# Patient Record
Sex: Male | Born: 2000 | Race: White | Hispanic: No | Marital: Single | Smoking: Never smoker
Health system: Southern US, Community
[De-identification: ages and names within clinical notes are randomized; demographics above are authoritative.]

## PROBLEM LIST (undated history)

## (undated) DIAGNOSIS — S43006A Unspecified dislocation of unspecified shoulder joint, initial encounter: Secondary | ICD-10-CM

## (undated) DIAGNOSIS — F32A Depression, unspecified: Secondary | ICD-10-CM

## (undated) DIAGNOSIS — U071 COVID-19: Secondary | ICD-10-CM

## (undated) DIAGNOSIS — F329 Major depressive disorder, single episode, unspecified: Secondary | ICD-10-CM

---
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
## 2019-07-12 ENCOUNTER — Encounter: Payer: Self-pay | Admitting: Emergency Medicine

## 2019-07-12 ENCOUNTER — Emergency Department: Payer: Medicaid Other

## 2019-07-12 ENCOUNTER — Emergency Department
Admission: EM | Admit: 2019-07-12 | Discharge: 2019-07-12 | Disposition: A | Discharge status: Home / Self Care | Payer: Medicaid Other | Attending: Emergency Medicine | Admitting: Emergency Medicine

## 2019-07-12 ENCOUNTER — Emergency Department: Payer: Medicaid Other | Attending: Emergency Medicine

## 2019-07-12 ENCOUNTER — Other Ambulatory Visit: Payer: Self-pay

## 2019-07-12 DIAGNOSIS — Y9389 Activity, other specified: Secondary | ICD-10-CM | POA: Insufficient documentation

## 2019-07-12 DIAGNOSIS — Y990 Civilian activity done for income or pay: Secondary | ICD-10-CM | POA: Insufficient documentation

## 2019-07-12 DIAGNOSIS — Y9259 Other trade areas as the place of occurrence of the external cause: Secondary | ICD-10-CM | POA: Insufficient documentation

## 2019-07-12 DIAGNOSIS — X500XXA Overexertion from strenuous movement or load, initial encounter: Secondary | ICD-10-CM | POA: Diagnosis not present

## 2019-07-12 DIAGNOSIS — S43015A Anterior dislocation of left humerus, initial encounter: Secondary | ICD-10-CM | POA: Insufficient documentation

## 2019-07-12 DIAGNOSIS — S43005A Unspecified dislocation of left shoulder joint, initial encounter: Secondary | ICD-10-CM

## 2019-07-12 DIAGNOSIS — S4992XA Unspecified injury of left shoulder and upper arm, initial encounter: Secondary | ICD-10-CM | POA: Diagnosis present

## 2019-07-12 MED ORDER — ONDANSETRON HCL 4 MG/2ML IJ SOLN
4.0000 mg | Freq: Once | INTRAMUSCULAR | Status: AC
  Administered 2019-07-12: 4 mg via INTRAVENOUS
  Filled 2019-07-12: qty 2

## 2019-07-12 MED ORDER — FENTANYL CITRATE (PF) 100 MCG/2ML IJ SOLN
100.0000 ug | Freq: Once | INTRAMUSCULAR | Status: AC
  Administered 2019-07-12: 100 ug via INTRAVENOUS
  Filled 2019-07-12: qty 2

## 2019-07-12 MED ORDER — MELOXICAM 15 MG PO TABS: 15.0000 mg | ORAL_TABLET | Freq: Every day | ORAL | 0 refills | Status: DC

## 2019-07-12 NOTE — ED Triage Notes (Signed)
Patient presents to the ED with left shoulder pain.  Patient states he tried to pick up a box at work and heard multiple pops.  Patient states he has dislocated his left shoulder previously and this feels the same.  Patient is in no obvious distress at this time.

## 2019-07-12 NOTE — ED Notes (Signed)
ED Provider at bedside. 

## 2019-07-12 NOTE — ED Notes (Signed)
Patient's supervisor, Annia Belt, states patient does not need breath analysis or UDS.

## 2019-07-12 NOTE — ED Provider Notes (Signed)
[facility] Emergency Department Provider Note  ____________________________________________  Time seen: Approximately 7:10 PM  I have reviewed the triage vital signs and the nursing notes.   HISTORY  Chief Complaint Shoulder Pain    HPI Andre Beck is a 18 y.o. male who presents the emergency department complaining of left shoulder pain/injury.  Patient states that he was at work, attempted to lift a medium weight box out of a truck when he felt his shoulder "give away", and heard a popping sound.  Patient  states that he has dislocated the shoulder approximately 8 months ago.  Symptoms felt exactly the same and patient has reduced range of motion to the shoulder.  No other injury or complaint.  No medications prior to arrival.        History reviewed. No pertinent past medical history.  There are no active problems to display for this patient.   History reviewed. No pertinent surgical history.  Prior to Admission medications   Medication Sig Start Date End Date Taking? Authorizing Provider  meloxicam (MOBIC) 15 MG tablet Take 1 tablet (15 mg total) by mouth daily. 07/12/19   Deshanna Kama, Charline Bills, PA-C    Allergies Patient has no known allergies.  No family history on file.  Social History Social History   Tobacco Use  . Smoking status: Never Smoker  . Smokeless tobacco: Never Used  Substance Use Topics  . Alcohol use: Never    Frequency: Never  . Drug use: Never     Review of Systems  Constitutional: No fever/chills Eyes: No visual changes. No discharge ENT: No upper respiratory complaints. Cardiovascular: no chest pain. Respiratory: no cough. No SOB. Gastrointestinal: No abdominal pain.  No nausea, no vomiting.  No diarrhea.  No constipation. Musculoskeletal: Positive for left shoulder injury/possible dislocation Skin: Negative for rash, abrasions, lacerations, ecchymosis. Neurological: Negative for headaches, focal weakness or  numbness. 10-point ROS otherwise negative.  ____________________________________________   PHYSICAL EXAM:  VITAL SIGNS: ED Triage Vitals  Enc Vitals Group     BP 07/12/19 1759 (!) 141/76     Pulse Rate 07/12/19 1759 98     Resp 07/12/19 1759 16     Temp 07/12/19 1759 98.6 F (37 C)     Temp Source 07/12/19 1759 Oral     SpO2 07/12/19 1759 100 %     Weight 07/12/19 1800 150 lb (68 kg)     Height 07/12/19 1800 5\' 8"  (1.727 m)     Head Circumference --      Peak Flow --      Pain Score 07/12/19 1800 10     Pain Loc --      Pain Edu? --      Excl. in [place]? --      Constitutional: Alert and oriented. Well appearing and in no acute distress. Eyes: Conjunctivae are normal. PERRL. EOMI. Head: Atraumatic. ENT:      Ears:       Nose: No congestion/rhinnorhea.      Mouth/Throat: Mucous membranes are moist.  Neck: No stridor.    Cardiovascular: Normal rate, regular rhythm. Normal S1 and S2.  Good peripheral circulation. Respiratory: Normal respiratory effort without tachypnea or retractions. Lungs CTAB. Good air entry to the bases with no decreased or absent breath sounds. Musculoskeletal: Full range of motion to all extremities. No gross deformities appreciated.  Visualization of the left shoulder reveals deformity with identified deformity along the anterior aspect of the shoulder.  No range of motion at  this time.  Patient has good range of motion to the elbow, wrist.  Palpation reveals palpable deficit with palpable abnormality along the anterior aspect consistent with shoulder dislocation.  Examination of the elbow and wrist is unremarkable.  Radial pulse intact distally.  Sensation intact all digits distally.  After reduction, no ongoing visible or palpable abnormality.  Patient has good range of motion to the shoulder joint at this time.  Pulse and sensation intact distally after reduction. Neurologic:  Normal speech and language. No gross focal neurologic deficits are appreciated.   Skin:  Skin is warm, dry and intact. No rash noted. Psychiatric: Mood and affect are normal. Speech and behavior are normal. Patient exhibits appropriate insight and judgement.   ____________________________________________   LABS (all labs ordered are listed, but only abnormal results are displayed)  Labs Reviewed - No data to display ____________________________________________  EKG   ____________________________________________  RADIOLOGY I personally viewed and evaluated these images as part of my medical decision making, as well as reviewing the written report by the radiologist.  Dg Shoulder Left  Result Date: 07/12/2019 CLINICAL DATA:  Status post reduction EXAM: LEFT SHOULDER - 2+ VIEW COMPARISON:  Film from earlier in the same day. FINDINGS: Humeral head is been reduced into the glenoid. No fracture or dislocation is noted. No soft tissue abnormality is seen. IMPRESSION: Status post reduction. Electronically Signed   By: Alcide CleverMark  Lukens M.D.   On: 07/12/2019 20:37   Dg Shoulder Left  Result Date: 07/12/2019 CLINICAL DATA:  Left shoulder pain, popping injury. EXAM: LEFT SHOULDER - 2+ VIEW COMPARISON:  11/19/2018 FINDINGS: There is anterior inferior dislocation of the right humeral head with SPECT of the glenoid. No definite Hill-Sachs impaction or bony Bankart injury. No other significant bony abnormality. IMPRESSION: 1. Anterior-inferior dislocation of the humeral head with respect to the glenoid. Electronically Signed   By: Gaylyn RongWalter  Liebkemann M.D.   On: 07/12/2019 19:17    ____________________________________________    PROCEDURES  Procedure(s) performed:    Reduction of dislocation  Date/Time: 07/12/2019 8:19 PM Performed by: Racheal Patchesuthriell, Katerine Morua D, PA-C Authorized by: Racheal Patchesuthriell, Azarie Coriz D, PA-C  Consent: Verbal consent obtained. Risks and benefits: risks, benefits and alternatives were discussed Consent given by: patient Patient understanding: patient states  understanding of the procedure being performed Imaging studies: imaging studies available Required items: required blood products, implants, devices, and special equipment available Patient identity confirmed: verbally with patient Time out: Immediately prior to procedure a "time out" was called to verify the correct patient, procedure, equipment, support staff and site/side marked as required. Local anesthesia used: no  Anesthesia: Local anesthesia used: no  Sedation: Patient sedated: no  Patient tolerance: patient tolerated the procedure well with no immediate complications Comments: Patient had good pulses and sensation prior to procedure.  Patient was placed prone, using downward traction, muscles were fatigued.  Using traction with elbow slightly flexed, outward/lateral rotation of the humerus with manipulation of the scapula resulted in good reduction of dislocation.  Patient has good range of motion, pulses and sensation status post procedure.  Patient tolerated procedure well without complication.  Marland Kitchen.Splint Application  Date/Time: 07/12/2019 8:21 PM Performed by: Racheal Patchesuthriell, Kristan Votta D, PA-C Authorized by: Racheal Patchesuthriell, Goldie Tregoning D, PA-C   Consent:    Consent obtained:  Verbal   Consent given by:  Patient   Risks discussed:  Pain Pre-procedure details:    Sensation:  Normal Procedure details:    Laterality:  Left   Location:  Shoulder   Shoulder:  L  shoulder   Supplies:  Sling Post-procedure details:    Pain:  Improved   Sensation:  Normal   Patient tolerance of procedure:  Tolerated well, no immediate complications      Medications  fentaNYL (SUBLIMAZE) injection 100 mcg (100 mcg Intravenous Given 07/12/19 1938)  ondansetron (ZOFRAN) injection 4 mg (4 mg Intravenous Given 07/12/19 1938)     ____________________________________________   INITIAL IMPRESSION / ASSESSMENT AND PLAN / ED COURSE  Pertinent labs & imaging results that were available during my care of  the patient were reviewed by me and considered in my medical decision making (see chart for details).  Review of the [place] CSRS was performed in accordance of the [facility] prior to dispensing any controlled drugs.           Patient's diagnosis is consistent with left shoulder dislocation.  Patient presented to emergency department with left shoulder injury.  Patient has a history of previous shoulder dislocations.  Findings were consistent with shoulder dislocation at this time.  Shoulder was successfully reduced as described above.  Patient tolerated well.  Sling given to the patient for mobilization.  Follow-up with orthopedics.  Meloxicam for symptom relief.. Patient is given ED precautions to return to the ED for any worsening or new symptoms.     ____________________________________________  FINAL CLINICAL IMPRESSION(S) / ED DIAGNOSES  Final diagnoses:  Dislocation of left shoulder joint, initial encounter      NEW MEDICATIONS STARTED DURING THIS VISIT:  ED Discharge Orders         Ordered    meloxicam (MOBIC) 15 MG tablet  Daily     07/12/19 2102              This chart was dictated using voice recognition software/Dragon. Despite best efforts to proofread, errors can occur which can change the meaning. Any change was purely unintentional.    Racheal Patches, PA-C 07/12/19 2103    Dionne Bucy, MD 07/12/19 2316

## 2019-07-12 NOTE — ED Notes (Signed)
Sling applied to left shoulder

## 2019-07-29 ENCOUNTER — Other Ambulatory Visit: Payer: Self-pay | Admitting: Orthopedic Surgery

## 2019-07-29 DIAGNOSIS — S43005A Unspecified dislocation of left shoulder joint, initial encounter: Secondary | ICD-10-CM

## 2019-08-13 ENCOUNTER — Other Ambulatory Visit: Payer: Self-pay

## 2019-08-13 ENCOUNTER — Ambulatory Visit
Admission: RE | Admit: 2019-08-13 | Discharge: 2019-08-13 | Disposition: A | Discharge status: Home / Self Care | Payer: Medicaid Other | Source: Ambulatory Visit | Attending: Orthopedic Surgery | Admitting: Orthopedic Surgery

## 2019-08-13 DIAGNOSIS — S43005A Unspecified dislocation of left shoulder joint, initial encounter: Secondary | ICD-10-CM | POA: Insufficient documentation

## 2019-08-13 MED ORDER — GADOBUTROL 1 MMOL/ML IV SOLN
2.0000 mL | Freq: Once | INTRAVENOUS | Status: AC | PRN
  Administered 2019-08-13: 0.5 mL

## 2019-08-13 MED ORDER — LIDOCAINE HCL (PF) 1 % IJ SOLN
5.0000 mL | Freq: Once | INTRAMUSCULAR | Status: AC
  Administered 2019-08-13: 5 mL
  Filled 2019-08-13: qty 5

## 2019-08-13 MED ORDER — IOHEXOL 180 MG/ML  SOLN
20.0000 mL | Freq: Once | INTRAMUSCULAR | Status: AC | PRN
  Administered 2019-08-13: 15 mL

## 2019-08-13 MED ORDER — SODIUM CHLORIDE (PF) 0.9 % IJ SOLN
10.0000 mL | INTRAMUSCULAR | Status: DC | PRN
  Administered 2019-08-13: 10 mL via INTRAVENOUS

## 2019-09-10 ENCOUNTER — Emergency Department
Admission: EM | Admit: 2019-09-10 | Discharge: 2019-09-10 | Disposition: A | Discharge status: Home / Self Care | Payer: Medicaid Other | Attending: Emergency Medicine | Admitting: Emergency Medicine

## 2019-09-10 ENCOUNTER — Emergency Department: Payer: Medicaid Other

## 2019-09-10 ENCOUNTER — Other Ambulatory Visit: Payer: Self-pay

## 2019-09-10 ENCOUNTER — Encounter: Payer: Self-pay | Admitting: Emergency Medicine

## 2019-09-10 DIAGNOSIS — S43015A Anterior dislocation of left humerus, initial encounter: Secondary | ICD-10-CM

## 2019-09-10 DIAGNOSIS — Y9389 Activity, other specified: Secondary | ICD-10-CM | POA: Diagnosis not present

## 2019-09-10 DIAGNOSIS — Y999 Unspecified external cause status: Secondary | ICD-10-CM | POA: Insufficient documentation

## 2019-09-10 DIAGNOSIS — S4991XA Unspecified injury of right shoulder and upper arm, initial encounter: Secondary | ICD-10-CM | POA: Diagnosis present

## 2019-09-10 DIAGNOSIS — Y929 Unspecified place or not applicable: Secondary | ICD-10-CM | POA: Insufficient documentation

## 2019-09-10 DIAGNOSIS — Z79899 Other long term (current) drug therapy: Secondary | ICD-10-CM | POA: Diagnosis not present

## 2019-09-10 DIAGNOSIS — W010XXA Fall on same level from slipping, tripping and stumbling without subsequent striking against object, initial encounter: Secondary | ICD-10-CM | POA: Diagnosis not present

## 2019-09-10 HISTORY — DX: Unspecified dislocation of unspecified shoulder joint, initial encounter: S43.006A

## 2019-09-10 MED ORDER — FENTANYL CITRATE (PF) 100 MCG/2ML IJ SOLN
50.0000 ug | Freq: Once | INTRAMUSCULAR | Status: AC
  Administered 2019-09-10: 02:00:00 50 ug via INTRAVENOUS

## 2019-09-10 MED ORDER — FENTANYL CITRATE (PF) 100 MCG/2ML IJ SOLN
INTRAMUSCULAR | Status: AC
  Filled 2019-09-10: qty 2

## 2019-09-10 MED ORDER — FENTANYL CITRATE (PF) 100 MCG/2ML IJ SOLN: 50.0000 ug | Freq: Once | INTRAMUSCULAR | Status: DC

## 2019-09-10 NOTE — ED Triage Notes (Signed)
Pt to triage via w/c, mask in place; reports falling and dislocating left shoulder (st 3rd time)

## 2019-09-10 NOTE — ED Notes (Signed)
Left shoulder reduced by Dr. York Cerise, immobilizer in place.

## 2019-09-10 NOTE — ED Provider Notes (Signed)
[facility] Emergency Department Provider Note  ____________________________________________   First MD Initiated Contact with Patient 09/10/19 0128     (approximate)  I have reviewed the triage vital signs and the nursing notes.   HISTORY  Chief Complaint Shoulder Injury    HPI Andre Beck is a 19 y.o. male with a history of 2 prior left shoulder dislocations who presents tonight after mechanical fall at home with acute onset severe pain similar to his prior dislocation.  He states that he sees Dr. Odis Luster with orthopedics and that he has had a recent MRI that showed no internal injury or tear.  He got up to go the bathroom tonight it was on his way back to bed when he tripped on something and fell, catching his left arm on something and causing the immediate pain and deformity.  He did not strike his head, did not lose consciousness, denies headache and neck pain.  Only pain is in his left shoulder.  Is severe and worse with any amount of movement.  He has no numbness nor tingling.  No chest pain or shortness of breath.  No nausea or vomiting.         Past Medical History:  Diagnosis Date  . Dislocated shoulder     There are no problems to display for this patient.   History reviewed. No pertinent surgical history.  Prior to Admission medications   Medication Sig Start Date End Date Taking? Authorizing Provider  meloxicam (MOBIC) 15 MG tablet Take 1 tablet (15 mg total) by mouth daily. 07/12/19   Cuthriell, Delorise Royals, PA-C    Allergies Patient has no known allergies.  No family history on file.  Social History Social History   Tobacco Use  . Smoking status: Never Smoker  . Smokeless tobacco: Never Used  Substance Use Topics  . Alcohol use: Never  . Drug use: Never    Review of Systems Constitutional: No fever/chills ENT: No sore throat. Cardiovascular: Denies chest pain. Respiratory: Denies shortness of breath. Gastrointestinal:  No abdominal pain.  No nausea, no vomiting.   Genitourinary: Negative for dysuria. Musculoskeletal: Acute onset severe pain and limited range of motion in the left shoulder. Integumentary: Negative for laceration. Neurological: Negative for headaches, focal weakness or numbness. Psychiatric:  No complaints or concerns  ____________________________________________   PHYSICAL EXAM:  VITAL SIGNS: ED Triage Vitals  Enc Vitals Group     BP 09/10/19 0100 (!) 134/97     Pulse Rate 09/10/19 0100 97     Resp 09/10/19 0100 18     Temp 09/10/19 0100 99 F (37.2 C)     Temp Source 09/10/19 0100 Oral     SpO2 09/10/19 0100 99 %     Weight 09/10/19 0059 68 kg (150 lb)     Height 09/10/19 0059 1.753 m (5\' 9" )     Head Circumference --      Peak Flow --      Pain Score 09/10/19 0059 10     Pain Loc --      Pain Edu? --      Excl. in GC? --     Constitutional: Alert and oriented.  Appears uncomfortable and in pain. Eyes: Conjunctivae are normal.  Head: Atraumatic. Nose: No congestion/rhinnorhea. Mouth/Throat: Patient is wearing a mask. Neck: No stridor.  No meningeal signs.   Cardiovascular: Normal rate, regular rhythm. Good peripheral circulation. Grossly normal heart sounds. Respiratory: Normal respiratory effort.  No retractions. Gastrointestinal: Soft and  nontender. No distention.  Musculoskeletal: Obvious deformity of the left shoulder consistent with anterior glenohumeral dislocation.  Limited range of motion. Neurologic:  Normal speech and language. No gross focal neurologic deficits are appreciated.  Skin:  Skin is warm, dry and intact. Psychiatric: Mood and affect are normal. Speech and behavior are normal.  ____________________________________________   LABS (all labs ordered are listed, but only abnormal results are displayed)  Labs Reviewed - No data to display ____________________________________________  EKG  None - EKG not ordered by ED  physician ____________________________________________  RADIOLOGY I, Hinda Kehr, personally viewed and evaluated these images (plain radiographs) as part of my medical decision making, as well as reviewing the written report by the radiologist.  ED MD interpretation: Anterior left glenohumeral dislocation with no obvious fracture.  Post reduction, there is near-anatomic alignment still with no definitive fracture identified.  Official radiology report(s): DG Shoulder Left  Result Date: 09/10/2019 CLINICAL DATA:  19 year old male with left shoulder pain and deformity after fall. History of prior dislocations. EXAM: LEFT SHOULDER - 2+ VIEW COMPARISON:  07/12/2019. FINDINGS: Anterior subcoracoid left glenohumeral joint dislocation. The proximal left humerus appears to remain intact. Left clavicle and scapula appear intact. Negative visible left ribs and chest. IMPRESSION: Anterior left glenohumeral dislocation with no fracture identified. Electronically Signed   By: Genevie Ann M.D.   On: 09/10/2019 00:43   DG Shoulder Left Portable  Result Date: 09/10/2019 CLINICAL DATA:  Postreduction EXAM: LEFT SHOULDER COMPARISON:  Radiograph same day FINDINGS: The patient is status post reduction of anterior shoulder dislocation. Humeral head is now well seated within the glenoid. No definite acute fracture seen. IMPRESSION: Status post reduction in near anatomic alignment. Electronically Signed   By: Prudencio Pair M.D.   On: 09/10/2019 02:15    ____________________________________________   PROCEDURES   Procedure(s) performed (including Critical Care):  .Ortho Injury Treatment  Date/Time: 09/10/2019 1:55 AM Performed by: Hinda Kehr, MD Authorized by: Hinda Kehr, MD   Consent:    Consent obtained:  Verbal and written   Consent given by:  Patient   Risks discussed:  Fracture, irreducible dislocation, nerve damage, recurrent dislocation, restricted joint movement and stiffnessInjury location:  shoulder Location details: left shoulder Injury type: dislocation Dislocation type: anterior Chronicity: recurrent Pre-procedure neurovascular assessment: neurovascularly intact Pre-procedure distal perfusion: normal Pre-procedure neurological function: normal Pre-procedure range of motion: reduced  Anesthesia: Local anesthesia used: no  Patient sedated: NoManipulation performed: yes Reduction method: external rotation Reduction successful: yes X-ray confirmed reduction: yes Immobilization: sling (shoulder immobilizer) Post-procedure neurovascular assessment: post-procedure neurovascularly intact Post-procedure distal perfusion: normal Post-procedure neurological function: normal Post-procedure range of motion: improved Patient tolerance: patient tolerated the procedure well with no immediate complications      ____________________________________________   INITIAL IMPRESSION / MDM / ASSESSMENT AND PLAN / ED COURSE  As part of my medical decision making, I reviewed the following data within the [place] notes reviewed and incorporated, Labs reviewed , Radiograph reviewed , Notes from prior ED visits and [place] Controlled Substance Database   Differential diagnosis includes, but is not limited to, recurrent shoulder dislocation, Hill-Sachs deformity, fracture.  No evidence of neurological injury.  The patient has had 2 dislocations previously and the last one did not require sedation.  I provided fentanyl 50 mcg IV and then performed a reduction with external rotation and felt the successful reduction.  The patient had immediate improvement of pain after the initial pain of the reduction.  Shoulder immobilizer was placed and  I am awaiting postreduction films.  He likely will be able to be discharged and follow-up with orthopedics.      Clinical Course as of Sep 10 235  Wed Sep 10, 2019  0229 Near-anatomic alignment post reduction.  Patient is  comfortable with no ongoing pain.  No evidence of fracture.  Patient will follow with Dr. Odis Luster with whom he already has a patient relationship.  I gave my usual customary management recommendations and return precautions.  DG Shoulder Left Portable [CF]    Clinical Course User Index [CF] Loleta Rose, MD     ____________________________________________  FINAL CLINICAL IMPRESSION(S) / ED DIAGNOSES  Final diagnoses:  Anterior dislocation of left shoulder, initial encounter     MEDICATIONS GIVEN DURING THIS VISIT:  Medications  fentaNYL (SUBLIMAZE) injection 50 mcg (50 mcg Intravenous Given 09/10/19 0142)     ED Discharge Orders    None      *Please note:  Andre Beck was evaluated in Emergency Department on 09/10/2019 for the symptoms described in the history of present illness. He was evaluated in the context of the global COVID-19 pandemic, which necessitated consideration that the patient might be at risk for infection with the SARS-CoV-2 virus that causes COVID-19. Institutional protocols and algorithms that pertain to the evaluation of patients at risk for COVID-19 are in a state of rapid change based on information released by regulatory bodies including the CDC and federal and state organizations. These policies and algorithms were followed during the patient's care in the ED.  Some ED evaluations and interventions may be delayed as a result of limited staffing during the pandemic.*  Note:  This document was prepared using Dragon voice recognition software and may include unintentional dictation errors.   Loleta Rose, MD 09/10/19 [phone]

## 2019-09-10 NOTE — ED Notes (Signed)
X-ray at bedside

## 2019-09-10 NOTE — Discharge Instructions (Addendum)
You have been seen in the Emergency Department (ED) today for a shoulder dislocation.  It was put back in place in the ED.  Please follow up as written with the recommend orthopedic surgeon.  It is important you leave the shoulder immobilizer in place until they tell you to remove it.  Use over-the-counter pain medication (ibuprofen, Tylenol, etc) as needed according to label instructions.  If you have worsening pain or swelling, if the shoulder dislocates again, or if you have any other symptoms that concern you, please return immediately to the Emergency Department.

## 2019-09-10 NOTE — ED Notes (Signed)
Verbal consent given per pt for reduction of left shoulder dislocation.

## 2019-10-13 ENCOUNTER — Other Ambulatory Visit: Payer: Self-pay | Admitting: Orthopedic Surgery

## 2019-10-27 ENCOUNTER — Other Ambulatory Visit: Payer: Self-pay

## 2019-10-27 ENCOUNTER — Encounter
Admission: RE | Admit: 2019-10-27 | Discharge: 2019-10-27 | Disposition: A | Discharge status: Home / Self Care | Payer: Medicaid Other | Source: Ambulatory Visit | Attending: Orthopedic Surgery | Admitting: Orthopedic Surgery

## 2019-10-27 DIAGNOSIS — Z01818 Encounter for other preprocedural examination: Secondary | ICD-10-CM | POA: Insufficient documentation

## 2019-10-27 HISTORY — DX: Depression, unspecified: F32.A

## 2019-10-27 NOTE — Patient Instructions (Signed)
Your procedure is scheduled on: Thursday November 06, 2019 Report to Day Surgery. To find out your arrival time please call [phone] between 1PM - 3PM on Wednesday November 05, 2019.  Remember: Instructions that are not followed completely may result in serious medical risk,  up to and including death, or upon the discretion of your surgeon and anesthesiologist your  surgery may need to be rescheduled.     _X__ 1. Do not eat food after midnight the night before your procedure.                 No gum chewing or hard candies. You may drink clear liquids up to 2 hours                 before you are scheduled to arrive for your surgery- DO not drink clear                 liquids within 2 hours of the start of your surgery.                 Clear Liquids include:  water, apple juice without pulp, clear Gatorade, G2 or                  Gatorade Zero (avoid Red/Purple/Blue), Black Coffee or Tea (Do not add                 anything to coffee or tea).  __X__2.  On the morning of surgery brush your teeth with toothpaste and water, you                may rinse your mouth with mouthwash if you wish.  Do not swallow any toothpaste of mouthwash.     _X__ 3.  No Alcohol for 24 hours before or after surgery.   _X__ 4.  Do Not Smoke or use e-cigarettes For 24 Hours Prior to Your Surgery.                 Do not use any chewable tobacco products for at least 6 hours prior to                 surgery.   __x__  5.  Notify your doctor if there is any change in your medical condition      (cold, fever, infections).     Do not wear jewelry, make-up, hairpins, clips or nail polish. Do not wear lotions, powders, or perfumes. You may wear deodorant. Do not shave 48 hours prior to surgery. Men may shave face and neck. Do not bring valuables to the hospital.    [facility] is not responsible for any belongings or valuables.  Contacts, dentures or bridgework may not be worn into  surgery. Leave your suitcase in the car. After surgery it may be brought to your room. For patients admitted to the hospital, discharge time is determined by your treatment team.   Patients discharged the day of surgery will not be allowed to drive home.   Make arrangements for someone to be with you for the first 24 hours of your Same Day Discharge.   ____ Take these medicines the morning of surgery with A SIP OF WATER:    1. None     __x__ Use CHG Soap as directed  __x__ Stop Anti-inflammatories such as meloxicam (MOBIC), ibuprofen, Aleve, naproxen, aspirin and or BC powders     __x__ Stop supplements until after surgery.  _____ Do not  start any herbal supplements before your surgery.

## 2019-11-04 ENCOUNTER — Other Ambulatory Visit
Admission: RE | Admit: 2019-11-04 | Discharge: 2019-11-04 | Disposition: A | Discharge status: Home / Self Care | Payer: Medicaid Other | Source: Ambulatory Visit | Attending: Orthopedic Surgery | Admitting: Orthopedic Surgery

## 2019-11-04 ENCOUNTER — Other Ambulatory Visit: Payer: Self-pay

## 2019-11-04 DIAGNOSIS — Z20822 Contact with and (suspected) exposure to covid-19: Secondary | ICD-10-CM | POA: Insufficient documentation

## 2019-11-04 DIAGNOSIS — Z01812 Encounter for preprocedural laboratory examination: Secondary | ICD-10-CM | POA: Insufficient documentation

## 2019-11-04 LAB — CBC WITH DIFFERENTIAL/PLATELET
Abs Immature Granulocytes: 0.02 10*3/uL (ref 0.00–0.07)
Basophils Absolute: 0 10*3/uL (ref 0.0–0.1)
Basophils Relative: 1 %
Eosinophils Absolute: 0.2 10*3/uL (ref 0.0–0.5)
Eosinophils Relative: 3 %
HCT: 47.6 % (ref 39.0–52.0)
Hemoglobin: 16.5 g/dL (ref 13.0–17.0)
Immature Granulocytes: 0 %
Lymphocytes Relative: 40 %
Lymphs Abs: 2.2 10*3/uL (ref 0.7–4.0)
MCH: 30.3 pg (ref 26.0–34.0)
MCHC: 34.7 g/dL (ref 30.0–36.0)
MCV: 87.5 fL (ref 80.0–100.0)
Monocytes Absolute: 0.5 10*3/uL (ref 0.1–1.0)
Monocytes Relative: 10 %
Neutro Abs: 2.5 10*3/uL (ref 1.7–7.7)
Neutrophils Relative %: 46 %
Platelets: 300 10*3/uL (ref 150–400)
RBC: 5.44 MIL/uL (ref 4.22–5.81)
RDW: 11.7 % (ref 11.5–15.5)
WBC: 5.5 10*3/uL (ref 4.0–10.5)
nRBC: 0 % (ref 0.0–0.2)

## 2019-11-04 LAB — BASIC METABOLIC PANEL
Anion gap: 10 (ref 5–15)
BUN: 15 mg/dL (ref 6–20)
CO2: 27 mmol/L (ref 22–32)
Calcium: 9.6 mg/dL (ref 8.9–10.3)
Chloride: 101 mmol/L (ref 98–111)
Creatinine, Ser: 1.07 mg/dL (ref 0.61–1.24)
GFR calc Af Amer: >60 mL/min (ref >60)
GFR calc non Af Amer: >60 mL/min (ref >60)
Glucose, Bld: 99 mg/dL (ref 70–99)
Potassium: 3.9 mmol/L (ref 3.5–5.1)
Sodium: 138 mmol/L (ref 135–145)

## 2019-11-04 LAB — APTT: aPTT: 28 seconds (ref 24–36)

## 2019-11-04 LAB — PROTIME-INR
INR: 1 (ref 0.8–1.2)
Prothrombin Time: 12.9 seconds (ref 11.4–15.2)

## 2019-11-04 LAB — SARS CORONAVIRUS 2 (TAT 6-24 HRS): SARS Coronavirus 2: NEGATIVE

## 2019-11-06 ENCOUNTER — Ambulatory Visit
Admission: RE | Admit: 2019-11-06 | Discharge: 2019-11-06 | Disposition: A | Discharge status: Home / Self Care | Payer: Medicaid Other | Attending: Orthopedic Surgery | Admitting: Orthopedic Surgery

## 2019-11-06 ENCOUNTER — Other Ambulatory Visit: Payer: Self-pay

## 2019-11-06 ENCOUNTER — Encounter: Payer: Self-pay | Admitting: Orthopedic Surgery

## 2019-11-06 ENCOUNTER — Ambulatory Visit: Payer: Medicaid Other | Admitting: Anesthesiology

## 2019-11-06 ENCOUNTER — Ambulatory Visit: Payer: Medicaid Other

## 2019-11-06 ENCOUNTER — Encounter
Admission: RE | Disposition: A | Discharge status: Home / Self Care | Payer: Self-pay | Source: Home / Self Care | Attending: Orthopedic Surgery

## 2019-11-06 DIAGNOSIS — F419 Anxiety disorder, unspecified: Secondary | ICD-10-CM | POA: Diagnosis not present

## 2019-11-06 DIAGNOSIS — M24412 Recurrent dislocation, left shoulder: Secondary | ICD-10-CM | POA: Diagnosis present

## 2019-11-06 DIAGNOSIS — Z79899 Other long term (current) drug therapy: Secondary | ICD-10-CM | POA: Insufficient documentation

## 2019-11-06 DIAGNOSIS — F329 Major depressive disorder, single episode, unspecified: Secondary | ICD-10-CM | POA: Diagnosis not present

## 2019-11-06 DIAGNOSIS — S43439A Superior glenoid labrum lesion of unspecified shoulder, initial encounter: Secondary | ICD-10-CM

## 2019-11-06 HISTORY — PX: SHOULDER ARTHROSCOPY WITH CAPSULORRHAPHY: SHX6454

## 2019-11-06 SURGERY — SHOULDER ATHROSCOPY WITH CAPSULORRHAPHY
Anesthesia: General | Site: Shoulder | Laterality: Left

## 2019-11-06 MED ORDER — PROMETHAZINE HCL 25 MG/ML IJ SOLN: 6.2500 mg | INTRAMUSCULAR | Status: DC | PRN

## 2019-11-06 MED ORDER — MIDAZOLAM HCL 2 MG/2ML IJ SOLN
INTRAMUSCULAR | Status: AC
  Filled 2019-11-06: qty 2

## 2019-11-06 MED ORDER — DEXAMETHASONE SODIUM PHOSPHATE 10 MG/ML IJ SOLN
INTRAMUSCULAR | Status: DC | PRN
  Administered 2019-11-06: 5 mg via INTRAVENOUS

## 2019-11-06 MED ORDER — EPHEDRINE SULFATE 50 MG/ML IJ SOLN
INTRAMUSCULAR | Status: DC | PRN
  Administered 2019-11-06 (×2): 10 mg via INTRAVENOUS
  Administered 2019-11-06 (×2): 5 mg via INTRAVENOUS

## 2019-11-06 MED ORDER — LACTATED RINGERS IV SOLN: INTRAVENOUS | Status: DC

## 2019-11-06 MED ORDER — LIDOCAINE HCL (PF) 1 % IJ SOLN
INTRAMUSCULAR | Status: AC
  Filled 2019-11-06: qty 5

## 2019-11-06 MED ORDER — FENTANYL CITRATE (PF) 100 MCG/2ML IJ SOLN
INTRAMUSCULAR | Status: AC
  Filled 2019-11-06: qty 2

## 2019-11-06 MED ORDER — FENTANYL CITRATE (PF) 100 MCG/2ML IJ SOLN
INTRAMUSCULAR | Status: AC
  Administered 2019-11-06: 50 ug via INTRAVENOUS
  Filled 2019-11-06: qty 2

## 2019-11-06 MED ORDER — LIDOCAINE HCL (CARDIAC) PF 100 MG/5ML IV SOSY
PREFILLED_SYRINGE | INTRAVENOUS | Status: DC | PRN
  Administered 2019-11-06: 80 mg via INTRAVENOUS

## 2019-11-06 MED ORDER — ONDANSETRON HCL 4 MG/2ML IJ SOLN
INTRAMUSCULAR | Status: DC | PRN
  Administered 2019-11-06: 4 mg via INTRAVENOUS

## 2019-11-06 MED ORDER — PROPOFOL 10 MG/ML IV BOLUS
INTRAVENOUS | Status: AC
  Filled 2019-11-06: qty 20

## 2019-11-06 MED ORDER — BUPIVACAINE HCL (PF) 0.5 % IJ SOLN
INTRAMUSCULAR | Status: DC | PRN
  Administered 2019-11-06: 10 mL via PERINEURAL

## 2019-11-06 MED ORDER — MIDAZOLAM HCL 2 MG/2ML IJ SOLN
INTRAMUSCULAR | Status: AC
  Administered 2019-11-06: 1 mg via INTRAVENOUS
  Filled 2019-11-06: qty 2

## 2019-11-06 MED ORDER — ROCURONIUM BROMIDE 10 MG/ML (PF) SYRINGE
PREFILLED_SYRINGE | INTRAVENOUS | Status: AC
  Filled 2019-11-06: qty 10

## 2019-11-06 MED ORDER — ACETAMINOPHEN 160 MG/5ML PO SOLN
325.0000 mg | ORAL | Status: DC | PRN
  Filled 2019-11-06: qty 20.3

## 2019-11-06 MED ORDER — CEFAZOLIN SODIUM-DEXTROSE 2-4 GM/100ML-% IV SOLN
INTRAVENOUS | Status: AC
  Filled 2019-11-06: qty 100

## 2019-11-06 MED ORDER — OXYCODONE HCL 5 MG PO TABS: 5.0000 mg | ORAL_TABLET | ORAL | 0 refills | Status: DC | PRN

## 2019-11-06 MED ORDER — CHLORHEXIDINE GLUCONATE CLOTH 2 % EX PADS: 6.0000 | MEDICATED_PAD | Freq: Once | CUTANEOUS | Status: DC

## 2019-11-06 MED ORDER — ONDANSETRON HCL 4 MG/2ML IJ SOLN
INTRAMUSCULAR | Status: AC
  Administered 2019-11-06: 4 mg via INTRAVENOUS
  Filled 2019-11-06: qty 2

## 2019-11-06 MED ORDER — FENTANYL CITRATE (PF) 100 MCG/2ML IJ SOLN
100.0000 ug | Freq: Once | INTRAMUSCULAR | Status: AC
  Administered 2019-11-06: 50 ug via INTRAVENOUS

## 2019-11-06 MED ORDER — CEFAZOLIN SODIUM-DEXTROSE 2-4 GM/100ML-% IV SOLN
2.0000 g | INTRAVENOUS | Status: AC
  Administered 2019-11-06: 2 g via INTRAVENOUS

## 2019-11-06 MED ORDER — PROPOFOL 10 MG/ML IV BOLUS
INTRAVENOUS | Status: DC | PRN
  Administered 2019-11-06: 180 mg via INTRAVENOUS

## 2019-11-06 MED ORDER — FAMOTIDINE 20 MG PO TABS: 20.0000 mg | ORAL_TABLET | Freq: Once | ORAL | Status: AC

## 2019-11-06 MED ORDER — BUPIVACAINE LIPOSOME 1.3 % IJ SUSP: INTRAMUSCULAR | Status: DC | PRN

## 2019-11-06 MED ORDER — BUPIVACAINE HCL (PF) 0.5 % IJ SOLN
INTRAMUSCULAR | Status: AC
  Filled 2019-11-06: qty 10

## 2019-11-06 MED ORDER — ACETAMINOPHEN 325 MG PO TABS: 325.0000 mg | ORAL_TABLET | ORAL | Status: DC | PRN

## 2019-11-06 MED ORDER — FAMOTIDINE 20 MG PO TABS
ORAL_TABLET | ORAL | Status: AC
  Administered 2019-11-06: 20 mg via ORAL
  Filled 2019-11-06: qty 1

## 2019-11-06 MED ORDER — EPINEPHRINE PF 1 MG/ML IJ SOLN
INTRAMUSCULAR | Status: AC
  Filled 2019-11-06: qty 4

## 2019-11-06 MED ORDER — BUPIVACAINE LIPOSOME 1.3 % IJ SUSP
INTRAMUSCULAR | Status: DC | PRN
  Administered 2019-11-06: 15 mL via PERINEURAL

## 2019-11-06 MED ORDER — ONDANSETRON HCL 4 MG PO TABS: 4.0000 mg | ORAL_TABLET | Freq: Three times a day (TID) | ORAL | 0 refills | Status: DC | PRN

## 2019-11-06 MED ORDER — MIDAZOLAM HCL 2 MG/2ML IJ SOLN
2.0000 mg | Freq: Once | INTRAMUSCULAR | Status: AC
  Administered 2019-11-06: 1 mg via INTRAVENOUS

## 2019-11-06 MED ORDER — ONDANSETRON HCL 4 MG/2ML IJ SOLN: 4.0000 mg | Freq: Once | INTRAMUSCULAR | Status: AC

## 2019-11-06 MED ORDER — BUPIVACAINE LIPOSOME 1.3 % IJ SUSP
INTRAMUSCULAR | Status: AC
  Filled 2019-11-06: qty 20

## 2019-11-06 MED ORDER — FENTANYL CITRATE (PF) 100 MCG/2ML IJ SOLN
INTRAMUSCULAR | Status: DC | PRN
  Administered 2019-11-06 (×2): 50 ug via INTRAVENOUS

## 2019-11-06 MED ORDER — ROCURONIUM BROMIDE 100 MG/10ML IV SOLN
INTRAVENOUS | Status: DC | PRN
  Administered 2019-11-06: 40 mg via INTRAVENOUS

## 2019-11-06 MED ORDER — SUGAMMADEX SODIUM 200 MG/2ML IV SOLN
INTRAVENOUS | Status: DC | PRN
  Administered 2019-11-06: 150 mg via INTRAVENOUS

## 2019-11-06 MED ORDER — LIDOCAINE HCL (PF) 1 % IJ SOLN
INTRAMUSCULAR | Status: AC
  Filled 2019-11-06: qty 30

## 2019-11-06 MED ORDER — EPHEDRINE 5 MG/ML INJ
INTRAVENOUS | Status: AC
  Filled 2019-11-06: qty 10

## 2019-11-06 MED ORDER — HYDROCODONE-ACETAMINOPHEN 7.5-325 MG PO TABS: 1.0000 | ORAL_TABLET | Freq: Once | ORAL | Status: DC | PRN

## 2019-11-06 SURGICAL SUPPLY — 62 items
ADAPTER IRRIG TUBE 2 SPIKE SOL (ADAPTER) ×6 IMPLANT
ANCHOR SUT BIOC ST 3X145 (Anchor) ×12 IMPLANT
BUR RADIUS 4.0X18.5 (BURR) ×3 IMPLANT
BUR RADIUS 5.5 (BURR) ×3 IMPLANT
CANNULA 5.75X7 CRYSTAL CLEAR (CANNULA) ×6 IMPLANT
CANNULA PARTIAL THREAD 2X7 (CANNULA) ×3 IMPLANT
CANNULA TWIST IN 8.25X9CM (CANNULA) IMPLANT
CLOSURE WOUND 1/2 X4 (GAUZE/BANDAGES/DRESSINGS) ×1
COOLER POLAR GLACIER W/PUMP (MISCELLANEOUS) ×3 IMPLANT
COVER WAND RF STERILE (DRAPES) ×3 IMPLANT
CRADLE LAMINECT ARM (MISCELLANEOUS) ×6 IMPLANT
DEVICE SUCT BLK HOLE OR FLOOR (MISCELLANEOUS) IMPLANT
DRAPE 3/4 80X56 (DRAPES) ×3 IMPLANT
DRAPE INCISE IOBAN 66X45 STRL (DRAPES) ×3 IMPLANT
DRAPE SPLIT 6X30 W/TAPE (DRAPES) ×6 IMPLANT
DRAPE U-SHAPE 47X51 STRL (DRAPES) IMPLANT
DURAPREP 26ML APPLICATOR (WOUND CARE) ×9 IMPLANT
ELECT REM PT RETURN 9FT ADLT (ELECTROSURGICAL) ×3
ELECTRODE REM PT RTRN 9FT ADLT (ELECTROSURGICAL) ×1 IMPLANT
GAUZE SPONGE 4X4 12PLY STRL (GAUZE/BANDAGES/DRESSINGS) ×3 IMPLANT
GAUZE XEROFORM 1X8 LF (GAUZE/BANDAGES/DRESSINGS) ×3 IMPLANT
GLOVE BIOGEL PI IND STRL 9 (GLOVE) ×1 IMPLANT
GLOVE BIOGEL PI INDICATOR 9 (GLOVE) ×2
GLOVE SURG 9.0 ORTHO LTXF (GLOVE) ×6 IMPLANT
GOWN STRL REUS TWL 2XL XL LVL4 (GOWN DISPOSABLE) ×3 IMPLANT
IV LACTATED RINGER IRRG 3000ML (IV SOLUTION) ×22
IV LR IRRIG 3000ML ARTHROMATIC (IV SOLUTION) ×11 IMPLANT
KIT INSERTION 2.9 PUSHLOCK (KITS) IMPLANT
KIT PERC INSERT 3.0 KNTLS (KITS) ×3 IMPLANT
KIT STABILIZATION SHOULDER (MISCELLANEOUS) ×3 IMPLANT
KIT TURNOVER KIT A (KITS) ×3 IMPLANT
MANIFOLD NEPTUNE II (INSTRUMENTS) ×3 IMPLANT
MASK FACE SPIDER DISP (MASK) ×3 IMPLANT
MAT ABSORB  FLUID 56X50 GRAY (MISCELLANEOUS) ×4
MAT ABSORB FLUID 56X50 GRAY (MISCELLANEOUS) ×2 IMPLANT
NEEDLE HYPO 22GX1.5 SAFETY (NEEDLE) ×3 IMPLANT
PACK ARTHROSCOPY SHOULDER (MISCELLANEOUS) ×3 IMPLANT
PAD WRAPON POLAR SHDR XLG (MISCELLANEOUS) ×1 IMPLANT
SET TUBE SUCT SHAVER OUTFL 24K (TUBING) ×3 IMPLANT
SET TUBE TIP INTRA-ARTICULAR (MISCELLANEOUS) ×3 IMPLANT
SLING ULTRA II M (MISCELLANEOUS) ×3 IMPLANT
STRAP SAFETY 5IN WIDE (MISCELLANEOUS) ×3 IMPLANT
STRIP CLOSURE SKIN 1/2X4 (GAUZE/BANDAGES/DRESSINGS) ×2 IMPLANT
SUT ETHILON 4-0 (SUTURE) ×2
SUT ETHILON 4-0 FS2 18XMFL BLK (SUTURE) ×1
SUT LASSO 90 DEG SD STR (SUTURE) ×3 IMPLANT
SUT MNCRL 4-0 (SUTURE) ×2
SUT MNCRL 4-0 27XMFL (SUTURE) ×1
SUT PDS AB 0 CT1 27 (SUTURE) IMPLANT
SUT VIC AB 0 CT1 36 (SUTURE) IMPLANT
SUT VIC AB 2-0 CT2 27 (SUTURE) IMPLANT
SUTURE ETHLN 4-0 FS2 18XMF BLK (SUTURE) ×1 IMPLANT
SUTURE MNCRL 4-0 27XMF (SUTURE) ×1 IMPLANT
SUTURE TAPE FIBERLINK 1.3 LOOP (SUTURE) IMPLANT
SUTURETAPE FIBERLINK 1.3 LOOP (SUTURE)
TAPE MICROFOAM 4IN (TAPE) ×3 IMPLANT
TUBING ARTHRO INFLOW-ONLY STRL (TUBING) ×3 IMPLANT
TUBING CONNECTING 10 (TUBING) ×2 IMPLANT
TUBING CONNECTING 10' (TUBING) ×1
WAND HAND CNTRL MULTIVAC 90 (MISCELLANEOUS) ×3 IMPLANT
WAND WEREWOLF FLOW 90D (MISCELLANEOUS) IMPLANT
WRAPON POLAR PAD SHDR XLG (MISCELLANEOUS) ×3

## 2019-11-06 NOTE — Anesthesia Procedure Notes (Signed)
Procedure Name: Intubation Performed by: Fredderick Phenix, CRNA Pre-anesthesia Checklist: Patient identified, Emergency Drugs available, Suction available and Patient being monitored Patient Re-evaluated:Patient Re-evaluated prior to induction Oxygen Delivery Method: Circle system utilized Preoxygenation: Pre-oxygenation with 100% oxygen Induction Type: IV induction Ventilation: Mask ventilation without difficulty Laryngoscope Size: Mac and 4 Grade View: Grade I Tube type: Oral Tube size: 7.0 mm Number of attempts: 1 Airway Equipment and Method: Stylet and Oral airway Placement Confirmation: ETT inserted through vocal cords under direct vision,  positive ETCO2 and breath sounds checked- equal and bilateral Secured at: 22 cm Tube secured with: Tape Dental Injury: Teeth and Oropharynx as per pre-operative assessment

## 2019-11-06 NOTE — Anesthesia Procedure Notes (Addendum)
Anesthesia Regional Block: Interscalene brachial plexus block   Pre-Anesthetic Checklist: ,, timeout performed, Correct Patient, Correct Site, Correct Laterality, Correct Procedure, Correct Position, site marked, Risks and benefits discussed,  Surgical consent,  Pre-op evaluation,  At surgeon's request and post-op pain management  Laterality: Upper and Left  Prep: chloraprep       Needles:  Injection technique: Single-shot  Needle Type: Echogenic Stimulator Needle     Needle Length: 10cm  Needle Gauge: 21   Needle insertion depth: 7 cm   Additional Needles:   Procedures: Doppler guided,,,, ultrasound used (permanent image in chart),,,,  Motor weakness within 8 minutes.  Narrative:  Start time: 11/06/2019 8:18 AM End time: 11/06/2019 8:24 AM Injection made incrementally with aspirations every 5 mL.  Performed by: Personally  Anesthesiologist: Christia Reading, MD  Additional Notes: Functioning IV was confirmed and O2 [place]/monitors were applied. Light sedation administered as required, patient responsive throughout. A 21ga EchoStim needle was used. Sterile prep and drape,hand hygiene and sterile gloves were used.  Negative aspiration and negative test dose prior to incremental administration of local anesthetic. 1% Lidocaine for skin wheal, 4 ml. Total LA: 23ml - Exparel 51ml & 0.5% Bupivicaine 5ml. U/S images stored in chart. The patient tolerated the procedure well.

## 2019-11-06 NOTE — Transfer of Care (Signed)
Immediate Anesthesia Transfer of Care Note  Patient: Andre Beck  Procedure(s) Performed: SHOULDER ATHROSCOPY WITH CAPSULORRHAPHY (Left Shoulder)  Patient Location: PACU  Anesthesia Type:General  Level of Consciousness: awake, alert  and oriented  Airway & Oxygen Therapy: Patient Spontanous Breathing  Post-op Assessment: Report given to RN  Post vital signs: Reviewed and stable  Last Vitals:  Vitals Value Taken Time  BP 136/75 11/06/19 1216  Temp    Pulse 96 11/06/19 1217  Resp 18 11/06/19 1217  SpO2 99 % 11/06/19 1217  Vitals shown include unvalidated device data.  Last Pain:  Vitals:   11/06/19 0841  TempSrc:   PainSc: 0-No pain         Complications: No apparent anesthesia complications

## 2019-11-06 NOTE — Discharge Instructions (Signed)

## 2019-11-06 NOTE — Anesthesia Preprocedure Evaluation (Addendum)
Anesthesia Evaluation  Patient identified by MRN, date of birth, ID band Patient awake    Reviewed: Allergy & Precautions, H&P , NPO status , Patient's Chart, lab work & pertinent test results, reviewed documented beta blocker date and time   Airway Mallampati: I  TM Distance: >3 FB Neck ROM: full    Dental  (+) Teeth Intact   Pulmonary neg pulmonary ROS,    Pulmonary exam normal        Cardiovascular negative cardio ROS Normal cardiovascular exam     Neuro/Psych PSYCHIATRIC DISORDERS Anxiety Depression negative neurological ROS     GI/Hepatic negative GI ROS, Neg liver ROS, neg GERD  ,  Endo/Other  negative endocrine ROS  Renal/GU negative Renal ROS     Musculoskeletal negative musculoskeletal ROS (+)   Abdominal   Peds  Hematology negative hematology ROS (+)   Anesthesia Other Findings Past Medical History: 2020: Anxiety No date: Depression No date: Dislocated shoulder History reviewed. No pertinent surgical history. BMI    Body Mass Index: 22.14 kg/m     Reproductive/Obstetrics                            Anesthesia Physical Anesthesia Plan  ASA: II  Anesthesia Plan: General   Post-op Pain Management:  Regional for Post-op pain   Induction: Intravenous  PONV Risk Score and Plan: Ondansetron, Treatment may vary due to age or medical condition, Midazolam and Dexamethasone  Airway Management Planned: Oral ETT  Additional Equipment:   Intra-op Plan:   Post-operative Plan: Extubation in OR  Informed Consent: I have reviewed the patients History and Physical, chart, labs and discussed the procedure including the risks, benefits and alternatives for the proposed anesthesia with the patient or authorized representative who has indicated his/her understanding and acceptance.     Dental Advisory Given  Plan Discussed with: CRNA  Anesthesia Plan Comments:         Anesthesia Quick Evaluation

## 2019-11-06 NOTE — H&P (Signed)
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 Symptoms of instability and pain are significantly impairing activities of daily living.  His MRI did not show a labral tear, but clinically he has instability and he would benefit from a capsulorrhapy.  He has failed non-operative management.  He wishes to proceed with left shoulder arthroscopic capsulorrhaphy.   Past Medical History:  Diagnosis Date  . Anxiety 2020  . Depression   . Dislocated shoulder    History reviewed. No pertinent surgical history. Social History   Socioeconomic History  . Marital status: Single    Spouse name: Not on file  . Number of children: Not on file  . Years of education: Not on file  . Highest education level: Not on file  Occupational History  . Not on file  Tobacco Use  . Smoking status: Never Smoker  . Smokeless tobacco: Never Used  Substance and Sexual Activity  . Alcohol use: Never  . Drug use: Never  . Sexual activity: Not on file  Other Topics Concern  . Not on file  Social History Narrative  . Not on file   Social Determinants of Health   Financial Resource Strain:   . Difficulty of Paying Living Expenses:   Food Insecurity:   . Worried About Charity fundraiser in the Last Year:   . Arboriculturist in the Last Year:   Transportation Needs:   . Film/video editor (Medical):   Marland Kitchen Lack of Transportation (Non-Medical):   Physical Activity:   . Days of Exercise per Week:   . Minutes of Exercise per Session:   Stress:   . Feeling of Stress :   Social Connections:   . Frequency of Communication with Friends and Family:   . Frequency of Social Gatherings with Friends and Family:   . Attends Religious Services:   . Active Member of Clubs or Organizations:   . Attends Archivist Meetings:    Marland Kitchen Marital Status:    History reviewed. No pertinent family history. No Known Allergies Prior to Admission medications   Medication Sig Start Date End Date Taking? Authorizing Provider  sertraline (ZOLOFT) 50 MG tablet Take 50 mg by mouth daily. 09/06/19  Yes [provider]  meloxicam (MOBIC) 15 MG tablet Take 1 tablet (15 mg total) by mouth daily. Patient not taking: Reported on 10/20/2019 07/12/19   Cuthriell, Charline Bills, PA-C     Positive ROS: All other systems have been reviewed and were otherwise negative with the exception of those mentioned in the HPI and as above.  Physical Exam: General: Alert, no acute distress Cardiovascular: Regular rate and rhythm, no murmurs rubs or gallops.  No pedal edema Respiratory: Clear to auscultation bilaterally, no wheezes rales or rhonchi. No cyanosis, no use of accessory musculature GI: No organomegaly, abdomen is soft and non-tender nondistended with positive bowel sounds. Skin: Skin intact, no lesions within the operative field. Neurologic: Sensation intact distally Psychiatric: Patient is competent for consent with normal mood and affect Lymphatic: No cervical lymphadenopathy  MUSCULOSKELETAL: Left shoulder:  Full ROM.  + apprehension.  Increased humeral head excursion to manipulation.  NVI.  No upper extremity weakness noted.  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    I discussed the risks and benefits of surgery. The risks include but are not limited to infection, bleeding, nerve or blood vessel injury, joint stiffness or loss of motion, persistent pain, weakness or instability, failure of the repair, hardware failure and the need for further surgery. Medical risks include but are not limited to DVT and pulmonary embolism, myocardial infarction, stroke,  pneumonia, respiratory failure and death. Patient understood these risks and wished to proceed.     Juanell Fairly, MD   11/06/2019 9:34 AM

## 2019-11-06 NOTE — Op Note (Signed)
11/06/2019  7:19 PM  PATIENT:  Andre Beck  19 y.o. 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    I discussed the risks and benefits of surgery. The risks include but are not limited to infection, bleeding, nerve or blood vessel injury, joint stiffness or loss of motion, persistent pain, weakness or instability, and hardware failure and the need for further surgery. Medical risks include but are not limited to DVT and pulmonary embolism, myocardial infarction, stroke, pneumonia, respiratory failure and death. Patient understood these risks and wished to proceed.   OPERATIVE IMPLANTS: Arthrex bio suturetak anchors  4   OPERATIVE FINDINGS:  Left shoulder anterior inferior labral tear with [employer] lesion and increased anterior translation of the humeral head.  OPERATIVE PROCEDURE:  I met with the patient in the preoperative area.  I signed the left shoulder according the hospital's correct site of surgery protocol.  A preop history and physical was performed at the bedside.  Patient received an interscalene block with Exparel by the anesthesia service in the preoperative area.  I answered all questions by the patient. Patient was brought to the operating room and underwent general endotracheal intubation.  He was then positioned in a beachchair position. All bony prominences were adequately padded including the lower extremities.  Examination under anesthesia revealed full  passive range of motion with increased anterior angulation of the humeral head with load and shift testing, and a mild sulcus sign testing respectively.  The patient was then prepped and draped in a sterile fashion. The patient received 2 g of Ancef IV prior to the onset of the case.  A timeout was performed to verify the patient's name, date of birth, medical record number, correct site of surgery and correct procedure to be performed.The timeout was also used to verify the patient received antibiotics that all appropriate instruments, implants and radiographic studies were available in the room. Once all in attendance were in agreement case began.  Bony landmarks were drawn out with a surgical marker along with proposed incisions.  An 11 blade was used to establish a posterior portal through which the arthroscope was placed in the glenohumeral joint. An anterior portal was established under direct visualization using an 18-gauge spinal needle for localization. A 5.75 mm arthroscopic cannula was inserted through the anterior portal. A full diagnostic examination of the glenohumeral joint was performed.  Findings on arthroscopy included an anterior inferior labral tear with scarring of the labrum to the anterior glenoid.  Initially no anterior inferior labrum was visualized between the 6:00 to 9 o'clock position.  The rotator cuff tear was intact.  Patient had a significant Hill-Sachs lesion in the posterior humeral head.  With abduction external rotation visualized with arthroscopy, this Hill-Sachs lesion did not appear to be engaging.  There were no focal chondral lesions of the glenoid or humeral head.  The biceps tendon was intact.  The subscapularis was intact.  There were no loose bodies in the inferior recess and no evidence of a HAGL lesion.  An  anterolateral portal was established again under direct visualization using an 18-gauge spinal needle.  The A 7 mm cannula was placed through this  anterolateral portal.  An arthroscopic elevator was used to mobilize the anterior inferior labrum.  A 4.0 mm resector shaver blade was used to debride the anterior glenoid until punctate bleeding was identified.  Four Arthrex NIKE anchor were sequentially  placed along the anterior inferior glenoid beginning at approximately the 5:30 position extending to the 9 o'clock position.  The Arthrex percutaneous drill guide kit was used to position the drill guide to allow for the appropriate angle of approach to place an anchor at the 5:30 position.  Other more superior anchors were placed through the anterior portal.  A 90 degree Arthrex suture lasso was then used to shuttle a single limb of each anchor through the anterior inferior capsule and under the anterior inferior labrum.  An arthroscopic knot tying technique was used to approximate the anterior inferior labrum with the osseous glenoid.  Once the labral repair/capsulorrhaphy was complete, the patient no longer demonstrated increased anterior translation of the humeral head.  The anterior "bumper" was reestablished.  Final arthroscopic images of the repair were taken.  The glenohumeral joint was then copiously irrigated.  All arthroscopic instruments were removed.  The arthroscope was then placed into the subacromial space via the posterior portal.  There was no significant bursitis encountered.  There is no evidence of rotator cuff pathology.  The arthroscope was therefore removed.  No debridement of subacromial decompression was performed.    The 3 arthroscopic portals and stab incision for the percutaneous drill guide were closed with 4-0 nylon. A dry, sterile dressing was applied to the left shoulder along with a Polar Care sleeve. Patient's left arm was then placed in an abduction sling.  He was awoken and brought to PACU in stable condition. I was scrubbed and present for the entire case and all sharp and instrument counts were correct at the  conclusion the case. I spoke with the patient's mother by phone from the PACU to let her know the operation was performed successfully and without complication and that her son was stable in the recovery room.  I reviewed with her in detail postoperative instructions.  I answered all her questions.    Timoteo Gaul, MD

## 2019-11-13 NOTE — Anesthesia Postprocedure Evaluation (Signed)
Anesthesia Post Note  Patient: Andre Beck  Procedure(s) Performed: SHOULDER ATHROSCOPY WITH CAPSULORRHAPHY (Left Shoulder)  Patient location during evaluation: PACU Anesthesia Type: General Level of consciousness: awake and alert Pain management: pain level controlled (Good block functn) Vital Signs Assessment: post-procedure vital signs reviewed and stable Respiratory status: spontaneous breathing, nonlabored ventilation and respiratory function stable Cardiovascular status: blood pressure returned to baseline and stable Postop Assessment: no apparent nausea or vomiting Anesthetic complications: no     Last Vitals:  Vitals:   11/06/19 1309 11/06/19 1325  BP:  (!) 145/80  Pulse: 97 95  Resp:  16  Temp:  36.6 C  SpO2:  100%    Last Pain:  Vitals:   11/06/19 1325  TempSrc: Temporal  PainSc: 0-No pain                 Christia Reading

## 2020-02-23 IMAGING — RF DG FLUORO GUIDE NDL PLC/BX
1 series · 1 of 1 positions shown · non-contrast
Comparison: none

CLINICAL DATA: Status post dislocation.  Shoulder pain.

[Series 1: cp_standard · 0.26mm/px · 1 of 1 slices shown]
[im 1/1]
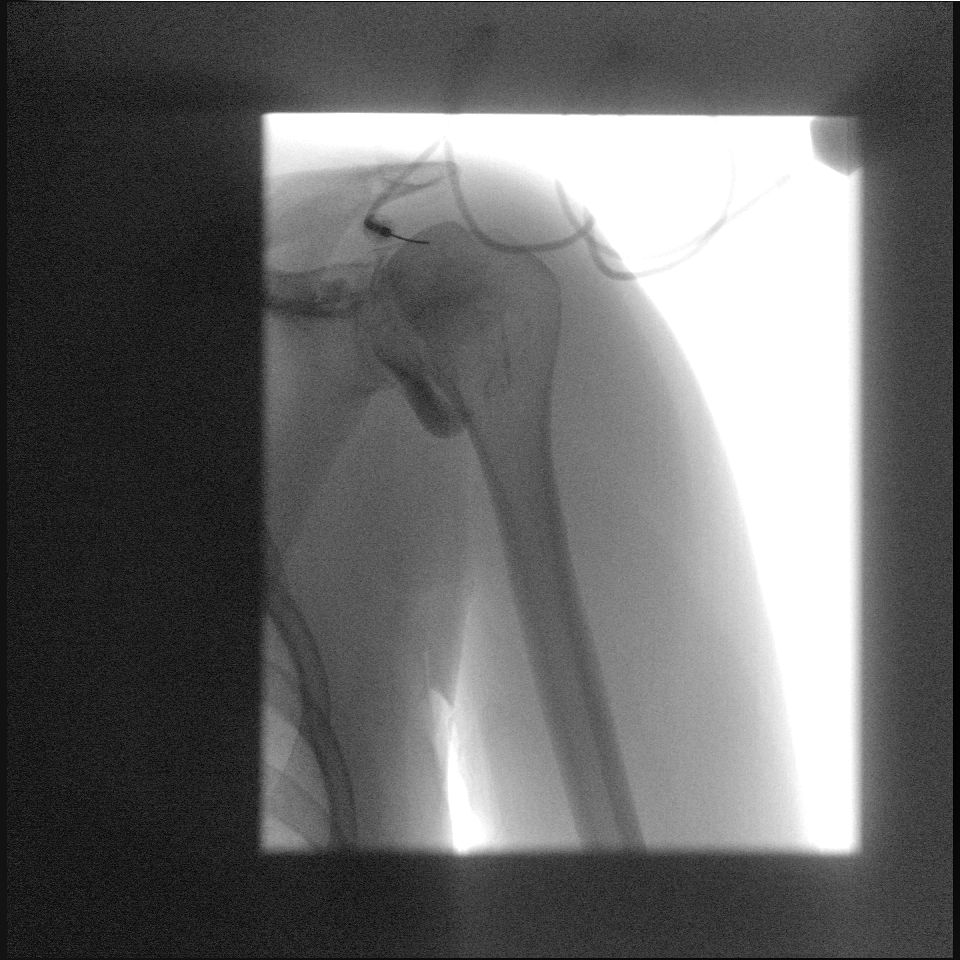

[1 of 1 positions shown; findings below may reference images not displayed]

EXAM:
LEFT SHOULDER ARTHROGRAM UNDER FLUOROSCOPY

FLUOROSCOPY TIME:  Fluoroscopy Time:  0.1 minute

Radiation Exposure Index (if provided by the fluoroscopic device):
0.4 mGy

Number of Acquired Spot Images: 0

PROCEDURE:
The risks and benefits of the procedure were discussed with the
patient, and written informed consent was obtained. The patient
stated no history of allergy to contrast media. A formal timeout
procedure was performed with the patient according to departmental
protocol.

The patient was placed supine on the fluoroscopy table and the left
glenohumeral joint was identified under fluoroscopy. The skin
overlying the left glenohumeral joint was subsequently cleaned with
Chloraprep and a sterile drape was placed over the area of interest.
5 ml 1% Lidocaine was used to anesthetize the skin around the needle
insertion site.

A 22 gauge spinal needle was inserted into the left glenohumeral
joint under fluoroscopy. Position was confirmed with injection of
less than 1ml of Omnipaque 180 under fluoroscopy.

12 ml of gadolinium mixture (0.05 mL of Gadavist mixed with 10 mL
sterile saline and 10 mL Omnipaque 180) was injected into the left
glenohumeral joint.

The needle was removed and hemostasis was achieved. The patient was
subsequently transferred to MRI for imaging.
IMPRESSION: Successful left shoulder arthrogram prior to MRI under fluoroscopic
guidance.

## 2020-02-26 DIAGNOSIS — M25312 Other instability, left shoulder: Secondary | ICD-10-CM | POA: Diagnosis not present

## 2020-02-26 DIAGNOSIS — Z419 Encounter for procedure for purposes other than remedying health state, unspecified: Secondary | ICD-10-CM | POA: Diagnosis not present

## 2020-02-26 DIAGNOSIS — M25612 Stiffness of left shoulder, not elsewhere classified: Secondary | ICD-10-CM | POA: Diagnosis not present

## 2020-02-26 DIAGNOSIS — M25512 Pain in left shoulder: Secondary | ICD-10-CM | POA: Diagnosis not present

## 2020-03-22 IMAGING — DX DG SHOULDER 1V*L*
2 series · 2 of 2 positions shown · non-contrast
Comparison: Radiograph same day

CLINICAL DATA: Postreduction

EXAM:
LEFT SHOULDER

[shoulder axial (1 of 2)]
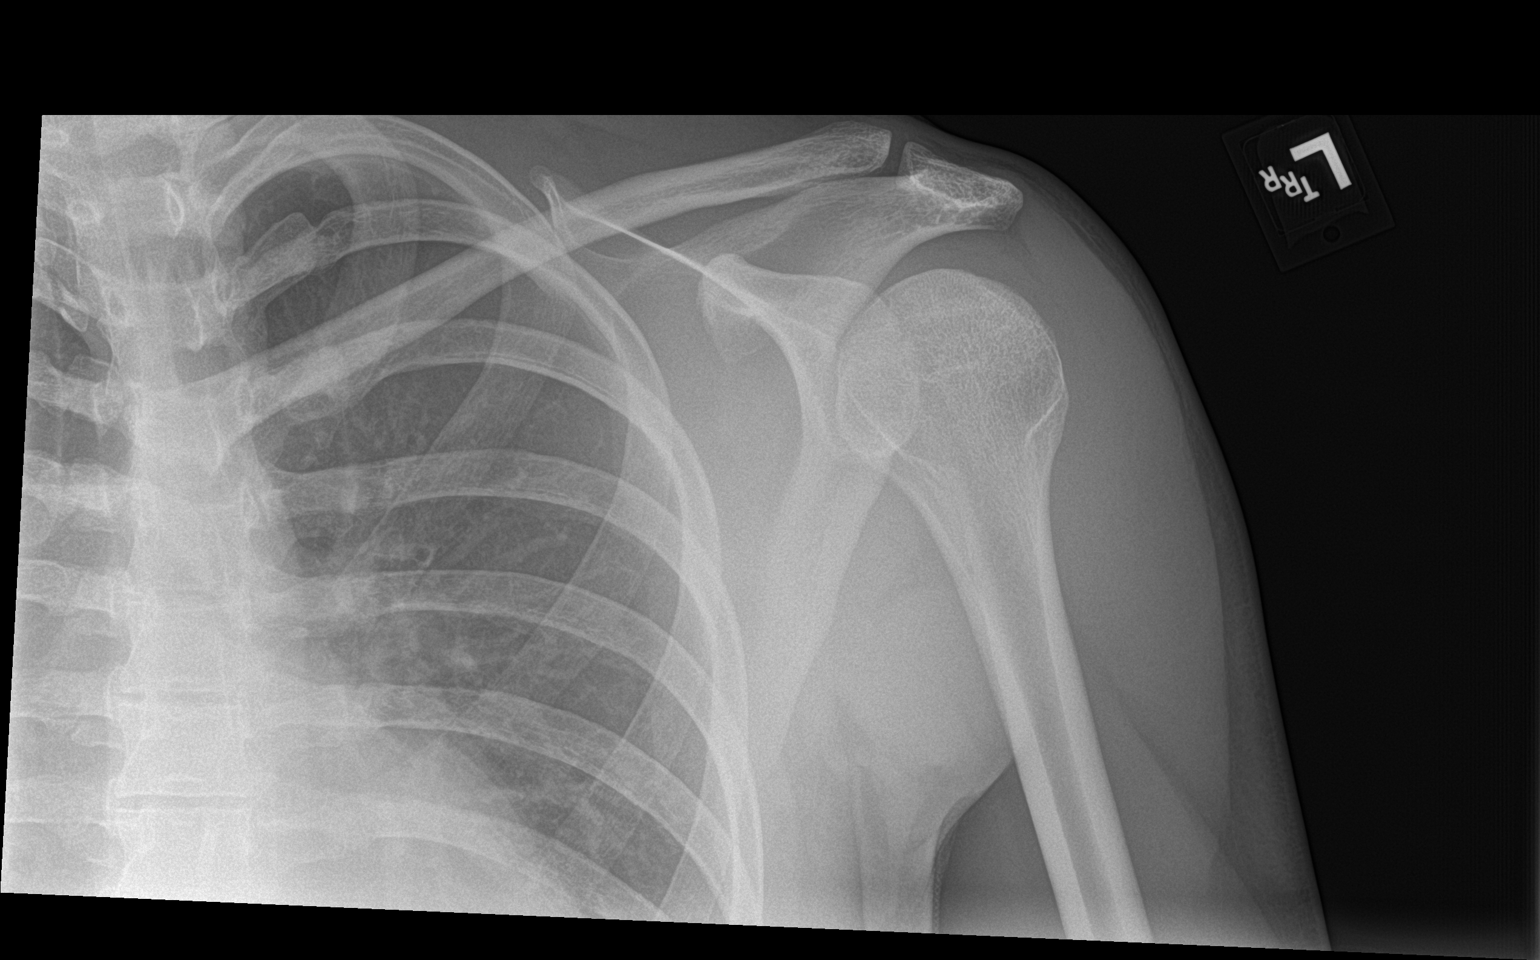

[shoulder axial (2 of 2)]
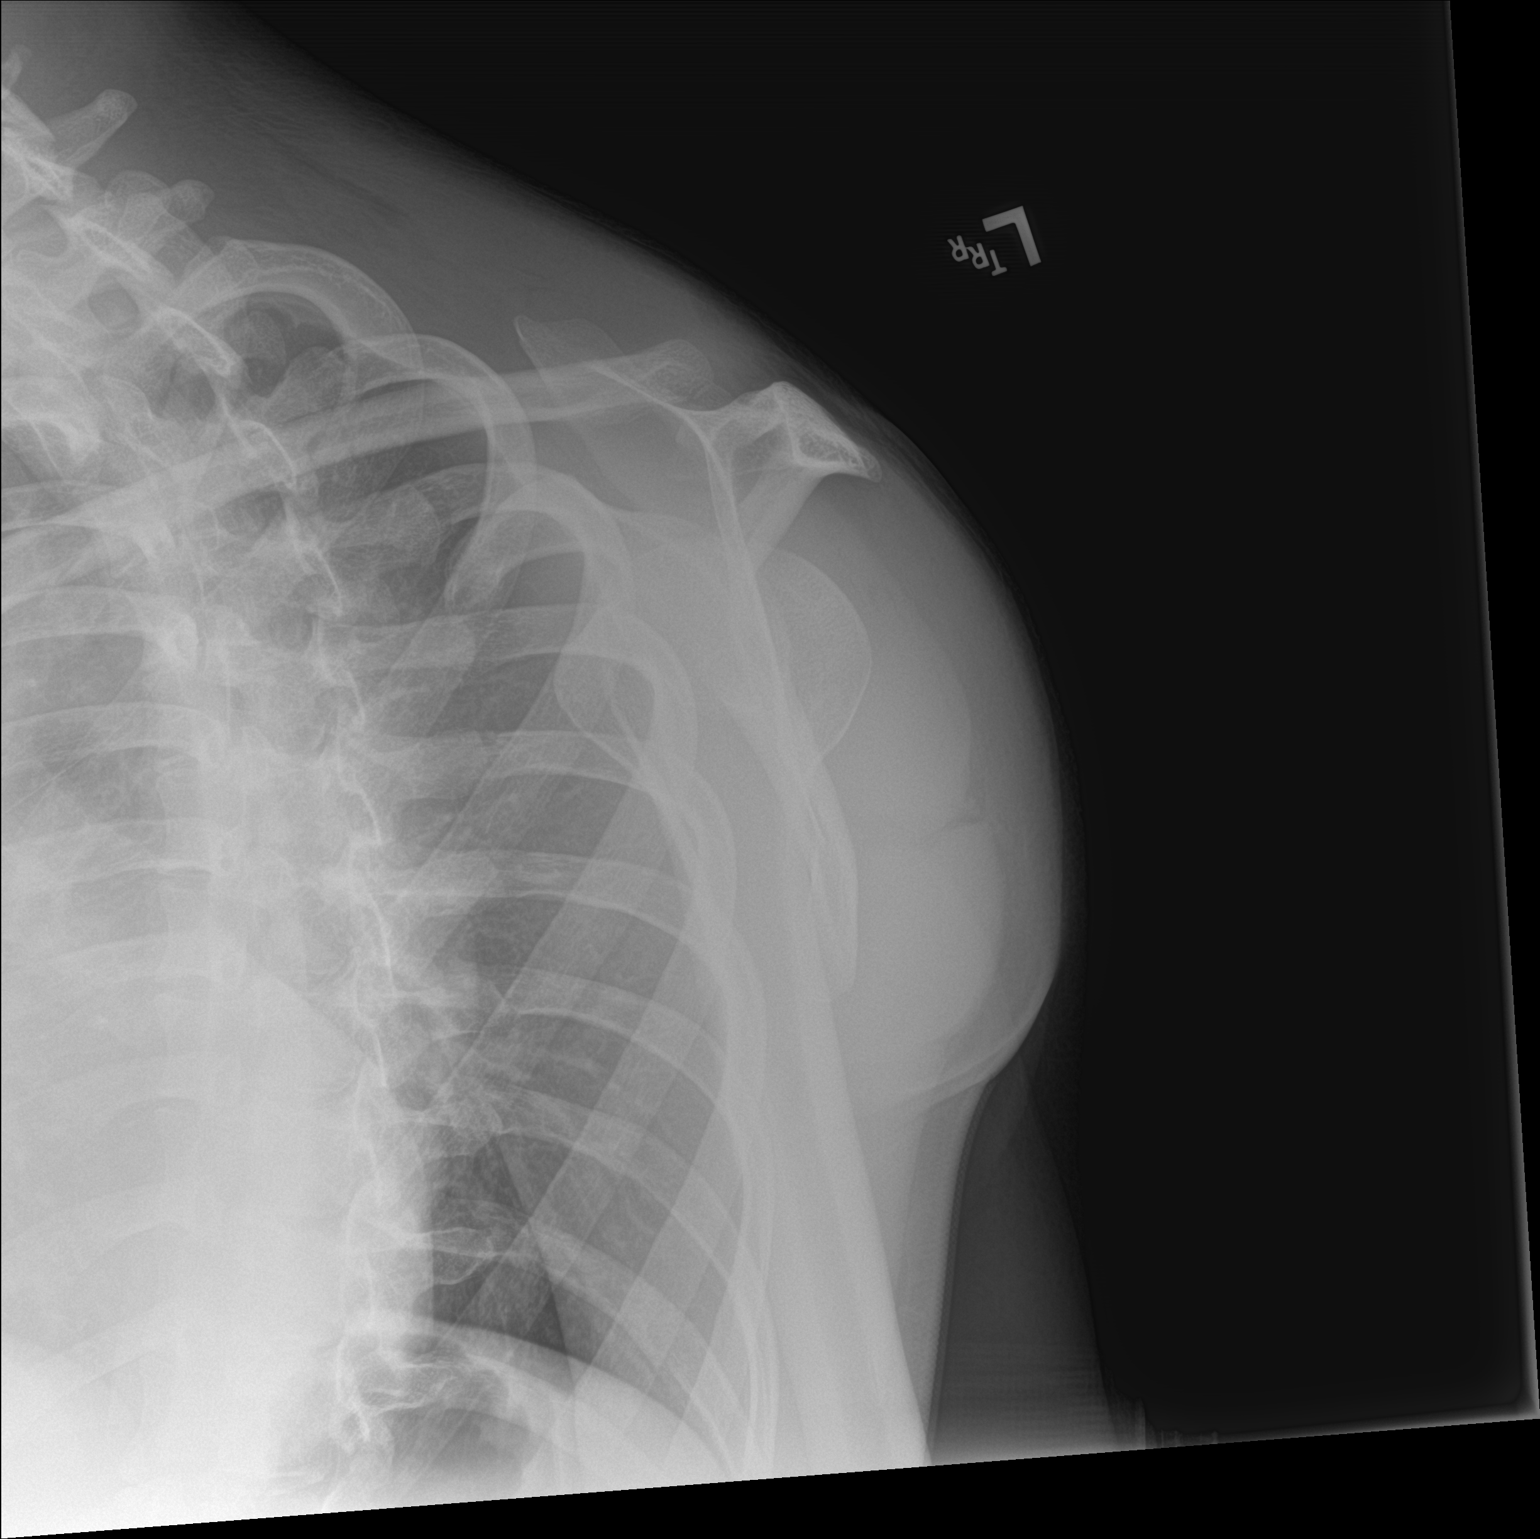

[2 of 2 positions shown; findings below may reference images not displayed]

FINDINGS: The patient is status post reduction of anterior shoulder
dislocation. Humeral head is now well seated within the glenoid. No
definite acute fracture seen.
IMPRESSION: Status post reduction in near anatomic alignment.

## 2020-03-25 DIAGNOSIS — R21 Rash and other nonspecific skin eruption: Secondary | ICD-10-CM | POA: Diagnosis not present

## 2020-03-25 DIAGNOSIS — L299 Pruritus, unspecified: Secondary | ICD-10-CM | POA: Diagnosis not present

## 2020-03-25 DIAGNOSIS — F4001 Agoraphobia with panic disorder: Secondary | ICD-10-CM | POA: Diagnosis not present

## 2020-03-25 DIAGNOSIS — F411 Generalized anxiety disorder: Secondary | ICD-10-CM | POA: Diagnosis not present

## 2020-03-28 DIAGNOSIS — Z419 Encounter for procedure for purposes other than remedying health state, unspecified: Secondary | ICD-10-CM | POA: Diagnosis not present

## 2020-04-28 DIAGNOSIS — Z419 Encounter for procedure for purposes other than remedying health state, unspecified: Secondary | ICD-10-CM | POA: Diagnosis not present

## 2020-05-28 DIAGNOSIS — Z419 Encounter for procedure for purposes other than remedying health state, unspecified: Secondary | ICD-10-CM | POA: Diagnosis not present

## 2020-06-28 DIAGNOSIS — Z419 Encounter for procedure for purposes other than remedying health state, unspecified: Secondary | ICD-10-CM | POA: Diagnosis not present

## 2020-07-28 DIAGNOSIS — Z419 Encounter for procedure for purposes other than remedying health state, unspecified: Secondary | ICD-10-CM | POA: Diagnosis not present

## 2020-08-28 DIAGNOSIS — Z419 Encounter for procedure for purposes other than remedying health state, unspecified: Secondary | ICD-10-CM | POA: Diagnosis not present

## 2020-09-28 DIAGNOSIS — Z419 Encounter for procedure for purposes other than remedying health state, unspecified: Secondary | ICD-10-CM | POA: Diagnosis not present

## 2020-10-26 DIAGNOSIS — Z419 Encounter for procedure for purposes other than remedying health state, unspecified: Secondary | ICD-10-CM | POA: Diagnosis not present

## 2020-11-26 DIAGNOSIS — Z419 Encounter for procedure for purposes other than remedying health state, unspecified: Secondary | ICD-10-CM | POA: Diagnosis not present

## 2020-11-27 ENCOUNTER — Other Ambulatory Visit: Payer: Self-pay

## 2020-11-27 ENCOUNTER — Encounter: Payer: Self-pay | Admitting: Radiology

## 2020-11-27 ENCOUNTER — Emergency Department
Admission: EM | Admit: 2020-11-27 | Discharge: 2020-11-27 | Disposition: A | Discharge status: Home / Self Care | Payer: BC Managed Care – PPO | Attending: Emergency Medicine | Admitting: Emergency Medicine

## 2020-11-27 ENCOUNTER — Emergency Department: Payer: BC Managed Care – PPO

## 2020-11-27 DIAGNOSIS — S43015A Anterior dislocation of left humerus, initial encounter: Secondary | ICD-10-CM | POA: Diagnosis not present

## 2020-11-27 DIAGNOSIS — X501XXA Overexertion from prolonged static or awkward postures, initial encounter: Secondary | ICD-10-CM | POA: Insufficient documentation

## 2020-11-27 DIAGNOSIS — Y9372 Activity, wrestling: Secondary | ICD-10-CM | POA: Insufficient documentation

## 2020-11-27 DIAGNOSIS — S6992XA Unspecified injury of left wrist, hand and finger(s), initial encounter: Secondary | ICD-10-CM | POA: Diagnosis present

## 2020-11-27 MED ORDER — HYDROMORPHONE HCL 1 MG/ML IJ SOLN
1.0000 mg | Freq: Once | INTRAMUSCULAR | Status: AC
  Administered 2020-11-27: 1 mg via INTRAVENOUS
  Filled 2020-11-27: qty 1

## 2020-11-27 MED ORDER — IBUPROFEN 600 MG PO TABS: 600.0000 mg | ORAL_TABLET | Freq: Three times a day (TID) | ORAL | 0 refills | Status: DC | PRN

## 2020-11-27 MED ORDER — BUPIVACAINE HCL 0.5 % IJ SOLN: 50.0000 mL | Freq: Once | INTRAMUSCULAR | Status: DC

## 2020-11-27 MED ORDER — LIDOCAINE HCL 2 % IJ SOLN
5.0000 mL | Freq: Once | INTRAMUSCULAR | Status: AC
  Administered 2020-11-27: 10 mg via INTRADERMAL
  Filled 2020-11-27: qty 10

## 2020-11-27 MED ORDER — PROPOFOL 10 MG/ML IV BOLUS
INTRAVENOUS | Status: AC | PRN
  Administered 2020-11-27: 80 mg via INTRAVENOUS
  Administered 2020-11-27: 40 mg via INTRAVENOUS

## 2020-11-27 MED ORDER — KETOROLAC TROMETHAMINE 30 MG/ML IJ SOLN
15.0000 mg | Freq: Once | INTRAMUSCULAR | Status: AC
  Administered 2020-11-27: 15 mg via INTRAVENOUS
  Filled 2020-11-27: qty 1

## 2020-11-27 MED ORDER — PROPOFOL 10 MG/ML IV BOLUS
80.0000 mg | Freq: Once | INTRAVENOUS | Status: AC
  Administered 2020-11-27: 80 mg via INTRAVENOUS
  Filled 2020-11-27: qty 20

## 2020-11-27 MED ORDER — BUPIVACAINE HCL (PF) 0.5 % IJ SOLN
50.0000 mL | Freq: Once | INTRAMUSCULAR | Status: AC
  Administered 2020-11-27: 50 mL

## 2020-11-27 MED ORDER — ONDANSETRON HCL 4 MG/2ML IJ SOLN
4.0000 mg | Freq: Once | INTRAMUSCULAR | Status: AC
  Administered 2020-11-27: 4 mg via INTRAVENOUS
  Filled 2020-11-27: qty 2

## 2020-11-27 MED ORDER — OXYCODONE-ACETAMINOPHEN 5-325 MG PO TABS: 1.0000 | ORAL_TABLET | Freq: Four times a day (QID) | ORAL | 0 refills | Status: DC | PRN

## 2020-11-27 NOTE — ED Provider Notes (Signed)
[facility] Emergency Department Provider Note  ____________________________________________   Event Date/Time   First MD Initiated Contact with Patient 11/27/20 1918     (approximate)  I have reviewed the triage vital signs and the nursing notes.   HISTORY  Chief Complaint Shoulder Injury    HPI Andre Beck is a 20 y.o. male  Here with L shoulder pain. Pt has h/o left shoulder dislocation. Reports that earlier this week he tried to wrestle and popped his left shoulder out of place. It spontaneously reduced. He went to see his Orthopedist and began light stretching, but it spontaneously popped out again this pm after stretching. Reports immediate onset of 10/10, aching, gnawing pain. Pain worse w/ any movement. No alleviating factors. No numbness or weakness. No other complaints.        Past Medical History:  Diagnosis Date  . Anxiety 2020  . Depression   . Dislocated shoulder     There are no problems to display for this patient.   Past Surgical History:  Procedure Laterality Date  . SHOULDER ARTHROSCOPY WITH CAPSULORRHAPHY Left 11/06/2019   Procedure: SHOULDER ATHROSCOPY WITH CAPSULORRHAPHY;  Surgeon: Juanell Fairly, MD;  Location: [facility] ORS;  Service: Orthopedics;  Laterality: Left;    Prior to Admission medications   Medication Sig Start Date End Date Taking? Authorizing Provider  ondansetron (ZOFRAN) 4 MG tablet Take 1 tablet (4 mg total) by mouth every 8 (eight) hours as needed for nausea or vomiting. 11/06/19   Juanell Fairly, MD  oxyCODONE (OXY IR/ROXICODONE) 5 MG immediate release tablet Take 1 tablet (5 mg total) by mouth every 4 (four) hours as needed. 11/06/19   Juanell Fairly, MD  sertraline (ZOLOFT) 50 MG tablet Take 50 mg by mouth daily. 09/06/19   [provider]    Allergies Patient has no known allergies.  No family history on file.  Social History Social History   Tobacco Use  . Smoking status: Never Smoker   . Smokeless tobacco: Never Used  Vaping Use  . Vaping Use: Never used  Substance Use Topics  . Alcohol use: Never  . Drug use: Never    Review of Systems  Review of Systems  Constitutional: Negative for chills and fever.  HENT: Negative for sore throat.   Respiratory: Negative for shortness of breath.   Cardiovascular: Negative for chest pain.  Gastrointestinal: Negative for abdominal pain.  Genitourinary: Negative for flank pain.  Musculoskeletal: Positive for arthralgias and joint swelling. Negative for neck pain.  Skin: Negative for rash and wound.  Allergic/Immunologic: Negative for immunocompromised state.  Neurological: Negative for weakness and numbness.  Hematological: Does not bruise/bleed easily.  All other systems reviewed and are negative.    ____________________________________________  PHYSICAL EXAM:      VITAL SIGNS: ED Triage Vitals [11/27/20 1913]  Enc Vitals Group     BP (!) 141/91     Pulse Rate 98     Resp 16     Temp 98.6 F (37 C)     Temp Source Oral     SpO2 99 %     Weight 160 lb (72.6 kg)     Height 5\' 9"  (1.753 m)     Head Circumference      Peak Flow      Pain Score 10     Pain Loc      Pain Edu?      Excl. in GC?      Physical Exam Vitals and nursing  note reviewed.  Constitutional:      General: He is not in acute distress.    Appearance: He is well-developed.  HENT:     Head: Normocephalic and atraumatic.  Eyes:     Conjunctiva/sclera: Conjunctivae normal.  Cardiovascular:     Rate and Rhythm: Normal rate and regular rhythm.     Heart sounds: Normal heart sounds.  Pulmonary:     Effort: Pulmonary effort is normal. No respiratory distress.     Breath sounds: No wheezing.  Abdominal:     General: There is no distension.  Musculoskeletal:     Cervical back: Neck supple.  Skin:    General: Skin is warm.     Capillary Refill: Capillary refill takes less than 2 seconds.     Findings: No rash.  Neurological:     Mental  Status: He is alert and oriented to person, place, and time.     Motor: No abnormal muscle tone.      UPPER EXTREMITY EXAM: LEFT  INSPECTION & PALPATION: Deformity to L shoulder, with palpable step off and anterior dislocation.  SENSORY: Sensation is intact to light touch in:  Superficial radial nerve distribution (dorsal first web space) Median nerve distribution (tip of index finger)   Ulnar nerve distribution (tip of small finger)     MOTOR:  + Motor posterior interosseous nerve (thumb IP extension) + Anterior interosseous nerve (thumb IP flexion, index finger DIP flexion) + Radial nerve (wrist extension) + Median nerve (palpable firing thenar mass) + Ulnar nerve (palpable firing of first dorsal interosseous muscle)  VASCULAR: 2+ radial pulse Brisk capillary refill < 2 sec, fingers warm and well-perfused   ____________________________________________   LABS (all labs ordered are listed, but only abnormal results are displayed)  Labs Reviewed - No data to display  ____________________________________________  EKG:  ________________________________________  RADIOLOGY All imaging, including plain films, CT scans, and ultrasounds, independently reviewed by me, and interpretations confirmed via formal radiology reads.  ED MD interpretation:   XR Shoulder left: left shoulder located  Official radiology report(s): DG Shoulder Left Portable  Result Date: 11/27/2020 CLINICAL DATA:  Postreduction EXAM: LEFT SHOULDER COMPARISON:  None. FINDINGS: There is no evidence of fracture or dislocation. There is no evidence of arthropathy or other focal bone abnormality. Soft tissues are unremarkable. IMPRESSION: Negative. Electronically Signed   By: Charlett NoseKevin  Dover M.D.   On: 11/27/2020 20:57    ____________________________________________  PROCEDURES   Procedure(s) performed (including Critical Care):  .Ortho Injury Treatment  Date/Time: 11/27/2020 9:00 PM Performed by: Shaune PollackIsaacs,  Vendela Troung, MD Authorized by: Shaune PollackIsaacs, Royelle Hinchman, MD   Consent:    Consent obtained:  Written   Consent given by:  Patient   Risks discussed:  Fracture, irreducible dislocation, nerve damage, recurrent dislocation, restricted joint movement, stiffness and vascular damage   Alternatives discussed:  Alternative treatmentInjury location: shoulder Location details: left shoulder Injury type: dislocation Dislocation type: anterior Hill-Sachs deformity: no Chronicity: recurrent Pre-procedure neurovascular assessment: neurovascularly intact Pre-procedure distal perfusion: normal Pre-procedure neurological function: normal Pre-procedure range of motion: reduced  Anesthesia: Local anesthesia used: yes Local Anesthetic: lidocaine 2% without epinephrine and bupivacaine 0.5% without epinephrine Anesthetic total: 10 mL  Patient sedated: Yes. Refer to sedation procedure documentation for details of sedation. Manipulation performed: yes Reduction method: external rotation Reduction successful: yes X-ray confirmed reduction: yes Immobilization: sling Splint Applied by: ED Provider Post-procedure neurovascular assessment: post-procedure neurovascularly intact Post-procedure distal perfusion: normal Post-procedure neurological function: normal Post-procedure range of motion: improved  .Sedation  Date/Time: 11/27/2020 9:01 PM Performed by: Shaune Pollack, MD Authorized by: Shaune Pollack, MD   Consent:    Consent obtained:  Verbal   Consent given by:  Patient   Risks discussed:  Allergic reaction, dysrhythmia, inadequate sedation, nausea, vomiting, prolonged sedation necessitating reversal, prolonged hypoxia resulting in organ damage and respiratory compromise necessitating ventilatory assistance and intubation   Alternatives discussed:  Analgesia without sedation Universal protocol:    Immediately prior to procedure, a time out was called: yes   Pre-sedation assessment:    Time since last  food or drink:  4   ASA classification: class 1 - normal, healthy patient     Mouth opening:  3 or more finger widths   Thyromental distance:  4 finger widths   Mallampati score:  I - soft palate, uvula, fauces, pillars visible   Pre-sedation assessments completed and reviewed: airway patency, cardiovascular function, hydration status, mental status, nausea/vomiting, pain level, respiratory function and temperature   Immediate pre-procedure details:    Reassessment: Patient reassessed immediately prior to procedure     Reviewed: vital signs     Verified: bag valve mask available   Procedure details (see MAR for exact dosages):    Preoxygenation:  Nasal cannula   Sedation:  Propofol   Intended level of sedation: deep   Intra-procedure monitoring:  Blood pressure monitoring, cardiac monitor, continuous capnometry, continuous pulse oximetry, frequent LOC assessments and frequent vital sign checks   Intra-procedure events: none     Total Provider sedation time (minutes):  15 Post-procedure details:    Attendance: Constant attendance by certified staff until patient recovered     Recovery: Patient returned to pre-procedure baseline     Post-sedation assessments completed and reviewed: airway patency, cardiovascular function, hydration status, mental status, nausea/vomiting, pain level, respiratory function and temperature     Patient is stable for discharge or admission: yes     Procedure completion:  Tolerated well, no immediate complications    ____________________________________________  INITIAL IMPRESSION / MDM / ASSESSMENT AND PLAN / ED COURSE  As part of my medical decision making, I reviewed the following data within the electronic MEDICAL RECORD NUMBER Nursing notes reviewed and incorporated, Old chart reviewed, Notes from prior ED visits, and [place] Controlled Substance Database       *Rhylan Kagel was evaluated in Emergency Department on 11/27/2020 for the symptoms described in the  history of present illness. He was evaluated in the context of the global COVID-19 pandemic, which necessitated consideration that the patient might be at risk for infection with the SARS-CoV-2 virus that causes COVID-19. Institutional protocols and algorithms that pertain to the evaluation of patients at risk for COVID-19 are in a state of rapid change based on information released by regulatory bodies including the CDC and federal and state organizations. These policies and algorithms were followed during the patient's care in the ED.  Some ED evaluations and interventions may be delayed as a result of limited staffing during the pandemic.*     Medical Decision Making:  20 yo M here with recurrent L shoulder dislocation. No direct trauma. After informed consent, pt anesthetized with lido/marcaine arthro block followed by conscious sedation and reduction. Tolerated well, placed in sling. Post-reduction films show successful reduction. Distal NV intact. Will d/c with outpt ortho follow-up, analgesia.  ____________________________________________  FINAL CLINICAL IMPRESSION(S) / ED DIAGNOSES  Final diagnoses:  Anterior dislocation of left shoulder, initial encounter     MEDICATIONS GIVEN DURING THIS VISIT:  Medications  propofol (  DIPRIVAN) 10 mg/mL bolus/IV push (80 mg Intravenous Given 11/27/20 2025)  lidocaine (XYLOCAINE) 2 % (with pres) injection 100 mg (10 mg Intradermal Given by Other 11/27/20 1931)  HYDROmorphone (DILAUDID) injection 1 mg (1 mg Intravenous Given 11/27/20 1929)  ondansetron (ZOFRAN) injection 4 mg (4 mg Intravenous Given 11/27/20 1929)  bupivacaine (MARCAINE) 0.5 % injection 50 mL (50 mLs Infiltration Given by Other 11/27/20 1931)  propofol (DIPRIVAN) 10 mg/mL bolus/IV push 80 mg (80 mg Intravenous Given by Other 11/27/20 2017)  ketorolac (TORADOL) 30 MG/ML injection 15 mg (15 mg Intravenous Given 11/27/20 2041)     ED Discharge Orders    None       Note:  This document was  prepared using Dragon voice recognition software and may include unintentional dictation errors.   Shaune Pollack, MD 11/27/20 2103

## 2020-11-27 NOTE — ED Triage Notes (Addendum)
Pt reports hx of shoulder surgery. Left shoulder dislocated once previously this week and pt was able to get it back into place. This occurrence pt was not able to get shoulder back into place and reports pain much worse than previous

## 2020-12-26 DIAGNOSIS — Z419 Encounter for procedure for purposes other than remedying health state, unspecified: Secondary | ICD-10-CM | POA: Diagnosis not present

## 2021-01-26 DIAGNOSIS — Z419 Encounter for procedure for purposes other than remedying health state, unspecified: Secondary | ICD-10-CM | POA: Diagnosis not present

## 2021-02-07 ENCOUNTER — Other Ambulatory Visit: Payer: Self-pay | Admitting: Orthopedic Surgery

## 2021-02-10 ENCOUNTER — Other Ambulatory Visit
Admission: RE | Admit: 2021-02-10 | Discharge: 2021-02-10 | Disposition: A | Discharge status: Home / Self Care | Payer: BC Managed Care – PPO | Source: Ambulatory Visit | Attending: Orthopedic Surgery | Admitting: Orthopedic Surgery

## 2021-02-10 ENCOUNTER — Other Ambulatory Visit: Payer: Self-pay

## 2021-02-10 HISTORY — DX: COVID-19: U07.1

## 2021-02-10 NOTE — Patient Instructions (Addendum)
Your procedure is scheduled on: 02/17/21 - Thursday Report to the Registration Desk on the 1st floor of the [facility]. To find out your arrival time, please call [phone] between 1PM - 3PM on: 02/16/21 - Wednesday  REMEMBER: Instructions that are not followed completely may result in serious medical risk, up to and including death; or upon the discretion of your surgeon and anesthesiologist your surgery may need to be rescheduled.  Do not eat food or drink any fluid after midnight the night before surgery.  No gum chewing, lozengers or hard candies.  TAKE THESE MEDICATIONS THE MORNING OF SURGERY WITH A SIP OF WATER: none  One week prior to surgery: Stop Anti-inflammatories (NSAIDS) such as Advil, Aleve, Ibuprofen, Motrin, Naproxen, Naprosyn and Aspirin based products such as Excedrin, Goodys Powder, BC Powder.  Stop ANY OVER THE COUNTER supplements until after surgery.  You may however, continue to take Tylenol if needed for pain up until the day of surgery.  No Alcohol for 24 hours before or after surgery.  No Smoking including e-cigarettes for 24 hours prior to surgery.  No chewable tobacco products for at least 6 hours prior to surgery.  No nicotine patches on the day of surgery.  Do not use any "recreational" drugs for at least a week prior to your surgery.  Please be advised that the combination of cocaine and anesthesia may have negative outcomes, up to and including death. If you test positive for cocaine, your surgery will be cancelled.  On the morning of surgery brush your teeth with toothpaste and water, you may rinse your mouth with mouthwash if you wish. Do not swallow any toothpaste or mouthwash.  Do not wear jewelry, make-up, hairpins, clips or nail polish.  Do not wear lotions, powders, or perfumes.   Do not shave body from the neck down 48 hours prior to surgery just in case you cut yourself which could leave a site for infection.  Also, freshly shaved  skin may become irritated if using the CHG soap.  Contact lenses, hearing aids and dentures may not be worn into surgery.  Do not bring valuables to the hospital. [facility] is not responsible for any missing/lost belongings or valuables.   TNotify your doctor if there is any change in your medical condition (cold, fever, infection).  Wear comfortable clothing (specific to your surgery type) to the hospital.  After surgery, you can help prevent lung complications by doing breathing exercises.  Take deep breaths and cough every 1-2 hours. Your doctor may order a device called an Incentive Spirometer to help you take deep breaths. When coughing or sneezing, hold a pillow firmly against your incision with both hands. This is called "splinting." Doing this helps protect your incision. It also decreases belly discomfort.  If you are being admitted to the hospital overnight, leave your suitcase in the car. After surgery it may be brought to your room.  If you are being discharged the day of surgery, you will not be allowed to drive home. You will need a responsible adult (18 years or older) to drive you home and stay with you that night.   If you are taking public transportation, you will need to have a responsible adult (18 years or older) with you. Please confirm with your physician that it is acceptable to use public transportation.   Please call the Pre-admissions Testing Dept. at [phone] if you have any questions about these instructions.  Surgery Visitation Policy:  Patients undergoing  a surgery or procedure may have one family member or support person with them as long as that person is not COVID-19 positive or experiencing its symptoms.  That person may remain in the waiting area during the procedure.  Inpatient Visitation:    Visiting hours are 7 a.m. to 8 p.m. Inpatients will be allowed two visitors daily. The visitors may change each day during the patient's stay. No  visitors under the age of 83. Any visitor under the age of 43 must be accompanied by an adult. The visitor must pass COVID-19 screenings, use hand sanitizer when entering and exiting the patient's room and wear a mask at all times, including in the patient's room. Patients must also wear a mask when staff or their visitor are in the room. Masking is required regardless of vaccination status.

## 2021-02-11 ENCOUNTER — Other Ambulatory Visit: Payer: Self-pay | Admitting: Orthopedic Surgery

## 2021-02-15 ENCOUNTER — Other Ambulatory Visit: Payer: BC Managed Care – PPO

## 2021-02-16 MED ORDER — ACETAMINOPHEN 500 MG PO TABS
1000.0000 mg | ORAL_TABLET | ORAL | Status: AC
  Administered 2021-02-17: 1000 mg via ORAL

## 2021-02-16 MED ORDER — CHLORHEXIDINE GLUCONATE CLOTH 2 % EX PADS: 6.0000 | MEDICATED_PAD | Freq: Once | CUTANEOUS | Status: DC

## 2021-02-16 MED ORDER — CEFAZOLIN SODIUM-DEXTROSE 2-4 GM/100ML-% IV SOLN
2.0000 g | INTRAVENOUS | Status: AC
  Administered 2021-02-17: 2 g via INTRAVENOUS

## 2021-02-16 MED ORDER — ORAL CARE MOUTH RINSE: 15.0000 mL | Freq: Once | OROMUCOSAL | Status: AC

## 2021-02-16 MED ORDER — CHLORHEXIDINE GLUCONATE 0.12 % MT SOLN
15.0000 mL | Freq: Once | OROMUCOSAL | Status: AC
  Administered 2021-02-17: 15 mL via OROMUCOSAL

## 2021-02-16 MED ORDER — FAMOTIDINE 20 MG PO TABS
20.0000 mg | ORAL_TABLET | Freq: Once | ORAL | Status: AC
  Administered 2021-02-17: 20 mg via ORAL

## 2021-02-16 MED ORDER — LACTATED RINGERS IV SOLN: INTRAVENOUS | Status: DC

## 2021-02-17 ENCOUNTER — Ambulatory Visit: Payer: BC Managed Care – PPO | Admitting: Anesthesiology

## 2021-02-17 ENCOUNTER — Other Ambulatory Visit: Payer: Self-pay

## 2021-02-17 ENCOUNTER — Encounter: Payer: Self-pay | Admitting: Orthopedic Surgery

## 2021-02-17 ENCOUNTER — Encounter
Admission: RE | Disposition: A | Discharge status: Home / Self Care | Payer: Self-pay | Source: Home / Self Care | Attending: Orthopedic Surgery

## 2021-02-17 ENCOUNTER — Ambulatory Visit
Admission: RE | Admit: 2021-02-17 | Discharge: 2021-02-17 | Disposition: A | Discharge status: Home / Self Care | Payer: BC Managed Care – PPO | Attending: Orthopedic Surgery | Admitting: Orthopedic Surgery

## 2021-02-17 ENCOUNTER — Ambulatory Visit: Payer: BC Managed Care – PPO

## 2021-02-17 DIAGNOSIS — X58XXXA Exposure to other specified factors, initial encounter: Secondary | ICD-10-CM | POA: Insufficient documentation

## 2021-02-17 DIAGNOSIS — M25312 Other instability, left shoulder: Secondary | ICD-10-CM | POA: Diagnosis not present

## 2021-02-17 DIAGNOSIS — Z8616 Personal history of COVID-19: Secondary | ICD-10-CM | POA: Diagnosis not present

## 2021-02-17 DIAGNOSIS — S43492A Other sprain of left shoulder joint, initial encounter: Secondary | ICD-10-CM | POA: Diagnosis not present

## 2021-02-17 DIAGNOSIS — Z419 Encounter for procedure for purposes other than remedying health state, unspecified: Secondary | ICD-10-CM

## 2021-02-17 HISTORY — PX: SHOULDER ARTHROSCOPY WITH LABRAL REPAIR: SHX5691

## 2021-02-17 SURGERY — ARTHROSCOPY, SHOULDER, WITH GLENOID LABRUM REPAIR
Anesthesia: General | Site: Shoulder | Laterality: Left

## 2021-02-17 MED ORDER — BUPIVACAINE LIPOSOME 1.3 % IJ SUSP
INTRAMUSCULAR | Status: AC
  Filled 2021-02-17: qty 20

## 2021-02-17 MED ORDER — LACTATED RINGERS IR SOLN
Status: DC | PRN
  Administered 2021-02-17: 9000 mL
  Administered 2021-02-17 (×2): 6000 mL
  Administered 2021-02-17: 3000 mL
  Administered 2021-02-17: 9000 mL
  Administered 2021-02-17: 6000 mL
  Administered 2021-02-17: 3000 mL
  Administered 2021-02-17 (×3): 9000 mL

## 2021-02-17 MED ORDER — OXYCODONE HCL 5 MG PO TABS: 5.0000 mg | ORAL_TABLET | ORAL | 0 refills | Status: AC | PRN

## 2021-02-17 MED ORDER — CEFAZOLIN SODIUM-DEXTROSE 2-4 GM/100ML-% IV SOLN
INTRAVENOUS | Status: AC
  Filled 2021-02-17: qty 100

## 2021-02-17 MED ORDER — PROPOFOL 10 MG/ML IV BOLUS
INTRAVENOUS | Status: DC | PRN
  Administered 2021-02-17: 200 mg via INTRAVENOUS

## 2021-02-17 MED ORDER — LIDOCAINE HCL (PF) 1 % IJ SOLN
INTRAMUSCULAR | Status: AC
  Filled 2021-02-17: qty 5

## 2021-02-17 MED ORDER — LACTATED RINGERS IV SOLN
INTRAVENOUS | Status: DC | PRN
  Administered 2021-02-17: 12000 mL

## 2021-02-17 MED ORDER — EPINEPHRINE PF 1 MG/ML IJ SOLN
INTRAMUSCULAR | Status: AC
  Filled 2021-02-17: qty 1

## 2021-02-17 MED ORDER — LACTATED RINGERS IV SOLN: INTRAVENOUS | Status: DC | PRN

## 2021-02-17 MED ORDER — ROCURONIUM BROMIDE 100 MG/10ML IV SOLN
INTRAVENOUS | Status: DC | PRN
  Administered 2021-02-17: 50 mg via INTRAVENOUS
  Administered 2021-02-17 (×2): 10 mg via INTRAVENOUS

## 2021-02-17 MED ORDER — FAMOTIDINE 20 MG PO TABS
ORAL_TABLET | ORAL | Status: AC
  Filled 2021-02-17: qty 1

## 2021-02-17 MED ORDER — MIDAZOLAM HCL 2 MG/2ML IJ SOLN
INTRAMUSCULAR | Status: AC
  Filled 2021-02-17: qty 2

## 2021-02-17 MED ORDER — FENTANYL CITRATE (PF) 100 MCG/2ML IJ SOLN
INTRAMUSCULAR | Status: AC
  Filled 2021-02-17: qty 2

## 2021-02-17 MED ORDER — LIDOCAINE HCL (PF) 1 % IJ SOLN
INTRAMUSCULAR | Status: DC | PRN
  Administered 2021-02-17: .8 mL via SUBCUTANEOUS

## 2021-02-17 MED ORDER — MIDAZOLAM HCL 2 MG/2ML IJ SOLN
1.0000 mg | Freq: Once | INTRAMUSCULAR | Status: AC
  Administered 2021-02-17: 2 mg via INTRAVENOUS

## 2021-02-17 MED ORDER — EPINEPHRINE PF 1 MG/ML IJ SOLN
INTRAMUSCULAR | Status: AC
  Filled 2021-02-17: qty 8

## 2021-02-17 MED ORDER — EPHEDRINE SULFATE 50 MG/ML IJ SOLN
INTRAMUSCULAR | Status: DC | PRN
  Administered 2021-02-17: 10 mg via INTRAVENOUS

## 2021-02-17 MED ORDER — PHENYLEPHRINE HCL (PRESSORS) 10 MG/ML IV SOLN
INTRAVENOUS | Status: DC | PRN
  Administered 2021-02-17: 100 ug via INTRAVENOUS

## 2021-02-17 MED ORDER — ONDANSETRON HCL 4 MG PO TABS: 4.0000 mg | ORAL_TABLET | Freq: Three times a day (TID) | ORAL | 0 refills | Status: AC | PRN

## 2021-02-17 MED ORDER — ROPIVACAINE HCL 5 MG/ML IJ SOLN
INTRAMUSCULAR | Status: AC
  Filled 2021-02-17: qty 30

## 2021-02-17 MED ORDER — PROPOFOL 10 MG/ML IV BOLUS
INTRAVENOUS | Status: AC
  Filled 2021-02-17: qty 20

## 2021-02-17 MED ORDER — BUPIVACAINE HCL (PF) 0.5 % IJ SOLN
INTRAMUSCULAR | Status: DC | PRN
  Administered 2021-02-17: 10 mL via PERINEURAL

## 2021-02-17 MED ORDER — ONDANSETRON HCL 4 MG/2ML IJ SOLN
INTRAMUSCULAR | Status: DC | PRN
  Administered 2021-02-17: 4 mg via INTRAVENOUS

## 2021-02-17 MED ORDER — DEXAMETHASONE SODIUM PHOSPHATE 10 MG/ML IJ SOLN
INTRAMUSCULAR | Status: DC | PRN
  Administered 2021-02-17: 10 mg via INTRAVENOUS

## 2021-02-17 MED ORDER — ACETAMINOPHEN 500 MG PO TABS
ORAL_TABLET | ORAL | Status: AC
  Filled 2021-02-17: qty 2

## 2021-02-17 MED ORDER — BUPIVACAINE LIPOSOME 1.3 % IJ SUSP
INTRAMUSCULAR | Status: DC | PRN
  Administered 2021-02-17: 20 mL via PERINEURAL

## 2021-02-17 MED ORDER — CHLORHEXIDINE GLUCONATE 0.12 % MT SOLN
OROMUCOSAL | Status: AC
  Filled 2021-02-17: qty 15

## 2021-02-17 MED ORDER — ONDANSETRON HCL 4 MG/2ML IJ SOLN
4.0000 mg | Freq: Once | INTRAMUSCULAR | Status: AC | PRN
  Administered 2021-02-17: 4 mg via INTRAVENOUS

## 2021-02-17 MED ORDER — FENTANYL CITRATE (PF) 100 MCG/2ML IJ SOLN: 25.0000 ug | INTRAMUSCULAR | Status: DC | PRN

## 2021-02-17 MED ORDER — FENTANYL CITRATE (PF) 100 MCG/2ML IJ SOLN
50.0000 ug | Freq: Once | INTRAMUSCULAR | Status: AC
Start: 2021-02-17 — End: 2021-02-17
  Administered 2021-02-17: 50 ug via INTRAVENOUS

## 2021-02-17 MED ORDER — ONDANSETRON HCL 4 MG/2ML IJ SOLN
INTRAMUSCULAR | Status: AC
  Filled 2021-02-17: qty 2

## 2021-02-17 MED ORDER — ACETAMINOPHEN 10 MG/ML IV SOLN
INTRAVENOUS | Status: AC
  Filled 2021-02-17: qty 100

## 2021-02-17 MED ORDER — LIDOCAINE HCL (CARDIAC) PF 100 MG/5ML IV SOSY
PREFILLED_SYRINGE | INTRAVENOUS | Status: DC | PRN
  Administered 2021-02-17: 100 mg via INTRAVENOUS

## 2021-02-17 MED ORDER — BUPIVACAINE HCL (PF) 0.5 % IJ SOLN
INTRAMUSCULAR | Status: AC
  Filled 2021-02-17: qty 10

## 2021-02-17 MED ORDER — SODIUM CHLORIDE FLUSH 0.9 % IV SOLN
INTRAVENOUS | Status: AC
  Filled 2021-02-17: qty 10

## 2021-02-17 SURGICAL SUPPLY — 63 items
ADAPTER IRRIG TUBE 2 SPIKE SOL (ADAPTER) ×4 IMPLANT
ADPR TBG 2 SPK PMP STRL ASCP (ADAPTER) ×2
ANCH SUT 2 SUTTK 14.5X3 (Anchor) ×5 IMPLANT
ANCHOR SUT BIOC ST 3X145 (Anchor) ×10 IMPLANT
BUR RADIUS 4.0X18.5 (BURR) ×2 IMPLANT
BUR RADIUS 5.5 (BURR) ×2 IMPLANT
CANISTER SUCT LVC 12 LTR MEDI- (MISCELLANEOUS) IMPLANT
CANNULA 5.75X7 CRYSTAL CLEAR (CANNULA) ×4 IMPLANT
CANNULA PARTIAL THREAD 2X7 (CANNULA) ×4 IMPLANT
CANNULA TWIST IN 8.25X9CM (CANNULA) IMPLANT
COOLER POLAR GLACIER W/PUMP (MISCELLANEOUS) ×2 IMPLANT
COVER WAND RF STERILE (DRAPES) ×2 IMPLANT
DEVICE SUCT BLK HOLE OR FLOOR (MISCELLANEOUS) ×6 IMPLANT
DRAPE 3/4 80X56 (DRAPES) ×2 IMPLANT
DRAPE IMP U-DRAPE 54X76 (DRAPES) ×4 IMPLANT
DRAPE INCISE IOBAN 66X45 STRL (DRAPES) ×2 IMPLANT
DRAPE U-SHAPE 47X51 STRL (DRAPES) IMPLANT
DURAPREP 26ML APPLICATOR (WOUND CARE) ×6 IMPLANT
ELECT REM PT RETURN 9FT ADLT (ELECTROSURGICAL)
ELECTRODE REM PT RTRN 9FT ADLT (ELECTROSURGICAL) IMPLANT
GAUZE SPONGE 4X4 12PLY STRL (GAUZE/BANDAGES/DRESSINGS) ×2 IMPLANT
GAUZE XEROFORM 1X8 LF (GAUZE/BANDAGES/DRESSINGS) ×2 IMPLANT
GLOVE SURG ORTHO LTX SZ9 (GLOVE) ×4 IMPLANT
GLOVE SURG UNDER POLY LF SZ9 (GLOVE) ×2 IMPLANT
GOWN STRL REUS TWL 2XL XL LVL4 (GOWN DISPOSABLE) ×2 IMPLANT
GOWN STRL REUS W/ TWL LRG LVL3 (GOWN DISPOSABLE) ×1 IMPLANT
GOWN STRL REUS W/ TWL LRG LVL4 (GOWN DISPOSABLE) ×1 IMPLANT
GOWN STRL REUS W/TWL LRG LVL3 (GOWN DISPOSABLE) ×2
GOWN STRL REUS W/TWL LRG LVL4 (GOWN DISPOSABLE) ×2
IV LACTATED RINGER IRRG 3000ML (IV SOLUTION) ×54
IV LR IRRIG 3000ML ARTHROMATIC (IV SOLUTION) ×27 IMPLANT
KIT STABILIZATION SHOULDER (MISCELLANEOUS) ×2 IMPLANT
KIT SUTURETAK 3.0 INSERT PERC (KITS) ×4 IMPLANT
KIT TURNOVER KIT A (KITS) ×2 IMPLANT
MANIFOLD NEPTUNE II (INSTRUMENTS) ×4 IMPLANT
MASK FACE SPIDER DISP (MASK) ×2 IMPLANT
MAT ABSORB  FLUID 56X50 GRAY (MISCELLANEOUS) ×2
MAT ABSORB FLUID 56X50 GRAY (MISCELLANEOUS) ×2 IMPLANT
NEEDLE HYPO 22GX1.5 SAFETY (NEEDLE) ×2 IMPLANT
PACK ARTHROSCOPY SHOULDER (MISCELLANEOUS) ×2 IMPLANT
PAD ARMBOARD 7.5X6 YLW CONV (MISCELLANEOUS) ×2 IMPLANT
PAD WRAPON POLAR SHDR XLG (MISCELLANEOUS) ×1 IMPLANT
SET TUBE SUCT SHAVER OUTFL 24K (TUBING) ×2 IMPLANT
SET TUBE TIP INTRA-ARTICULAR (MISCELLANEOUS) ×2 IMPLANT
SLING ULTRA II M (MISCELLANEOUS) ×2 IMPLANT
STRAP SAFETY 5IN WIDE (MISCELLANEOUS) ×2 IMPLANT
STRIP CLOSURE SKIN 1/2X4 (GAUZE/BANDAGES/DRESSINGS) ×2 IMPLANT
SUT ETHILON 4-0 (SUTURE) ×2
SUT ETHILON 4-0 FS2 18XMFL BLK (SUTURE) ×1
SUT LASSO 90 DEG SD STR (SUTURE) ×4 IMPLANT
SUT MNCRL 4-0 (SUTURE) ×2
SUT MNCRL 4-0 27XMFL (SUTURE) ×1
SUT PDS AB 0 CT1 27 (SUTURE) ×2 IMPLANT
SUT ULTRABRAID 2 COBRAID 38 (SUTURE) IMPLANT
SUT VIC AB 0 CT1 36 (SUTURE) IMPLANT
SUT VIC AB 2-0 CT2 27 (SUTURE) IMPLANT
SUTURE ETHLN 4-0 FS2 18XMF BLK (SUTURE) ×1 IMPLANT
SUTURE MNCRL 4-0 27XMF (SUTURE) ×1 IMPLANT
TAPE MICROFOAM 4IN (TAPE) ×2 IMPLANT
TUBING ARTHRO INFLOW-ONLY STRL (TUBING) ×2 IMPLANT
TUBING CONNECTING 10 (TUBING) ×2 IMPLANT
WAND WEREWOLF FLOW 90D (MISCELLANEOUS) ×2 IMPLANT
WRAPON POLAR PAD SHDR XLG (MISCELLANEOUS) ×2

## 2021-02-17 NOTE — H&P (Deleted)
PREOPERATIVE H&P  Chief Complaint: Left Shoulder Recurrent Labral Tear  HPI: Andre Beck is a 20 y.o. male who presents for preoperative history and physical with a diagnosis of recurrent Left Shoulder Labral Tear after a wrestling injury in March.  Patient is status post left shoulder capsulorrhaphy on 11/06/2019.  Symptoms of recurrent instability remain despite physical therapy preoperatively.  He wished to proceed with a revision left shoulder labral repair/capsulorrhaphy.    Past Medical History:  Diagnosis Date   Anxiety 2020   COVID-19    Depression    Dislocated shoulder    Past Surgical History:  Procedure Laterality Date   SHOULDER ARTHROSCOPY WITH CAPSULORRHAPHY Left 11/06/2019   Procedure: SHOULDER ATHROSCOPY WITH CAPSULORRHAPHY;  Surgeon: Juanell Fairly, MD;  Location: [facility] ORS;  Service: Orthopedics;  Laterality: Left;   Social History   Socioeconomic History   Marital status: Single    Spouse name: Not on file   Number of children: Not on file   Years of education: Not on file   Highest education level: Not on file  Occupational History   Not on file  Tobacco Use   Smoking status: Never   Smokeless tobacco: Never  Vaping Use   Vaping Use: Never used  Substance and Sexual Activity   Alcohol use: Never   Drug use: Yes    Types: Marijuana   Sexual activity: Not on file  Other Topics Concern   Not on file  Social History Narrative   Lives with girlfriend in apt   Social Determinants of Health   Financial Resource Strain: Not on file  Food Insecurity: Not on file  Transportation Needs: Not on file  Physical Activity: Not on file  Stress: Not on file  Social Connections: Not on file   History reviewed. No pertinent family history. No Known Allergies Prior to Admission medications   Medication Sig Start Date End Date Taking? Authorizing Provider  acetaminophen (TYLENOL) 325 MG tablet Take 650 mg by mouth every 6 (six) hours as needed for headache.   Yes  [provider]  Cholecalciferol (VITAMIN D3 PO) Take 1 tablet by mouth daily.   Yes [provider]  ibuprofen (ADVIL) 600 MG tablet Take 1 tablet (600 mg total) by mouth every 8 (eight) hours as needed for moderate pain. Patient not taking: No sig reported 11/27/20   Shaune Pollack, MD  ondansetron (ZOFRAN) 4 MG tablet Take 1 tablet (4 mg total) by mouth every 8 (eight) hours as needed for nausea or vomiting. Patient not taking: No sig reported 11/06/19   Juanell Fairly, MD  oxyCODONE-acetaminophen (PERCOCET) 5-325 MG tablet Take 1 tablet by mouth every 6 (six) hours as needed for severe pain. Patient not taking: No sig reported 11/27/20 11/27/21  Shaune Pollack, MD  sertraline (ZOLOFT) 50 MG tablet Take 50 mg by mouth daily. 09/06/19   [provider]     Positive ROS: All other systems have been reviewed and were otherwise negative with the exception of those mentioned in the HPI and as above.  Physical Exam: General: Alert, no acute distress Cardiovascular: Regular rate and rhythm, no murmurs rubs or gallops.  No pedal edema Respiratory: Clear to auscultation bilaterally, no wheezes rales or rhonchi. No cyanosis, no use of accessory musculature GI: No organomegaly, abdomen is soft and non-tender nondistended with positive bowel sounds. Skin: Skin intact, no lesions within the operative field. Neurologic: Sensation intact distally Psychiatric: Patient is competent for consent with normal mood and affect Lymphatic: No cervical  lymphadenopathy  MUSCULOSKELETAL: Left shoulder: The patient can forward elevate and abduct to 160 degrees. He has mild apprehension, but no obvious instability on exam today. He has no rotator cuff weakness. He has full digital, wrist and elbow range of motion, intact sensation to light touch and a palpable radial pulse.   Assessment: Left Shoulder Labral Tear  Plan: Plan for Procedure(s): REVISION LEFT SHOULDER ARTHROSCOPY WITH LABRAL  REPAIR  I reviewed the details of the operation as well as the postoperative course with the patient.  Answered all his questions.  A preop history and physical was performed at the bedside.  I reviewed his MRI and labs in preparation for this case.  I marked the left shoulder according the hospital's correct site of surgery protocol.   I discussed the risks and benefits of surgery. The risks include but are not limited to infection, bleeding, nerve or blood vessel injury, joint stiffness or loss of motion, persistent pain, weakness or glenohumeral instability, and the need for further surgery including open stabilization. Medical risks include but are not limited to DVT and pulmonary embolism, myocardial infarction, stroke, pneumonia, respiratory failure and death. Patient understood these risks and wished to proceed.     Juanell Fairly, MD   02/17/2021 7:45 AM

## 2021-02-17 NOTE — Anesthesia Preprocedure Evaluation (Addendum)
Anesthesia Evaluation  Patient identified by MRN, date of birth, ID band Patient awake    Reviewed: Allergy & Precautions, NPO status , Patient's Chart, lab work & pertinent test results  History of Anesthesia Complications Negative for: history of anesthetic complications  Airway Mallampati: I       Dental   Pulmonary neg sleep apnea, neg COPD, Not current smoker,           Cardiovascular (-) hypertension(-) Past MI and (-) CHF (-) dysrhythmias (-) Valvular Problems/Murmurs     Neuro/Psych neg Seizures Anxiety Depression    GI/Hepatic Neg liver ROS, neg GERD  ,  Endo/Other  neg diabetes  Renal/GU negative Renal ROS     Musculoskeletal   Abdominal   Peds  Hematology   Anesthesia Other Findings   Reproductive/Obstetrics                            Anesthesia Physical Anesthesia Plan  ASA: 2  Anesthesia Plan: General   Post-op Pain Management:    Induction: Intravenous  PONV Risk Score and Plan: 2  Airway Management Planned: Oral ETT  Additional Equipment:   Intra-op Plan:   Post-operative Plan:   Informed Consent: I have reviewed the patients History and Physical, chart, labs and discussed the procedure including the risks, benefits and alternatives for the proposed anesthesia with the patient or authorized representative who has indicated his/her understanding and acceptance.       Plan Discussed with:   Anesthesia Plan Comments:         Anesthesia Quick Evaluation

## 2021-02-17 NOTE — Anesthesia Procedure Notes (Signed)
Anesthesia Regional Block: Interscalene brachial plexus block   Pre-Anesthetic Checklist: , timeout performed,  Correct Patient, Correct Site, Correct Laterality,  Correct Procedure, Correct Position, site marked,  Risks and benefits discussed,  Surgical consent,  Pre-op evaluation,  At surgeon's request and post-op pain management  Laterality: Left  Prep: chloraprep       Needles:  Injection technique: Single-shot  Needle Type: Stimiplex     Needle Length: 5cm  Needle Gauge: 22     Additional Needles:   Procedures:, nerve stimulator,,, ultrasound used (permanent image in chart),,     Nerve Stimulator or Paresthesia:  Response: biceps flexion, 0.8 mA  Additional Responses:   Narrative:  Start time: 02/17/2021 7:49 AM End time: 02/17/2021 7:53 AM Injection made incrementally with aspirations every 5 mL.  Performed by: Personally   Additional Notes: Functioning IV was confirmed and monitors were applied.  A 37mm 22ga Stimuplex needle was used. Sterile prep and drape,hand hygiene and sterile gloves were used.  Negative aspiration and negative test dose prior to incremental administration of local anesthetic. The patient tolerated the procedure well.

## 2021-02-17 NOTE — Anesthesia Postprocedure Evaluation (Signed)
Anesthesia Post Note  Patient: Andre Beck  Procedure(s) Performed: REVISION LEFT SHOULDER ARTHROSCOPY WITH LABRAL REPAIR (Left: Shoulder)  Patient location during evaluation: PACU Anesthesia Type: General Level of consciousness: awake and alert Pain management: pain level controlled Vital Signs Assessment: post-procedure vital signs reviewed and stable Respiratory status: spontaneous breathing and respiratory function stable Cardiovascular status: stable Anesthetic complications: no   No notable events documented.   Last Vitals:  Vitals:   02/17/21 1055 02/17/21 1100  BP: 118/78 120/74  Pulse: 70 73  Resp: 18 (!) 21  Temp: (!) 36.2 C   SpO2: 100% 100%    Last Pain:  Vitals:   02/17/21 1055  TempSrc:   PainSc: Asleep                 Shavonta Gossen K

## 2021-02-17 NOTE — Transfer of Care (Signed)
Immediate Anesthesia Transfer of Care Note  Patient: Andre Beck  Procedure(s) Performed: REVISION LEFT SHOULDER ARTHROSCOPY WITH LABRAL REPAIR (Left: Shoulder)  Patient Location: PACU  Anesthesia Type:General  Level of Consciousness: awake and sedated  Airway & Oxygen Therapy: Patient Spontanous Breathing and Patient connected to face mask oxygen  Post-op Assessment: Report given to RN and Post -op Vital signs reviewed and stable  Post vital signs: Reviewed and stable  Last Vitals:  Vitals Value Taken Time  BP 118/78 02/17/21 1055  Temp 36.2 C 02/17/21 1055  Pulse 71 02/17/21 1058  Resp 17 02/17/21 1058  SpO2 100 % 02/17/21 1058  Vitals shown include unvalidated device data.  Last Pain:  Vitals:   02/17/21 0658  TempSrc:   PainSc: 0-No pain         Complications: No notable events documented.

## 2021-02-17 NOTE — Anesthesia Procedure Notes (Signed)
Procedure Name: Intubation Date/Time: 02/17/2021 8:11 AM Performed by: Danelle Berry, CRNA Pre-anesthesia Checklist: Patient identified, Emergency Drugs available, Suction available and Patient being monitored Patient Re-evaluated:Patient Re-evaluated prior to induction Oxygen Delivery Method: Circle system utilized Preoxygenation: Pre-oxygenation with 100% oxygen Induction Type: IV induction Ventilation: Mask ventilation without difficulty Laryngoscope Size: McGraph and 3 Grade View: Grade I Tube type: Oral Tube size: 7.0 mm Number of attempts: 1 Airway Equipment and Method: Stylet and Oral airway Placement Confirmation: ETT inserted through vocal cords under direct vision, positive ETCO2 and breath sounds checked- equal and bilateral Secured at: 22 cm Tube secured with: Tape Dental Injury: Teeth and Oropharynx as per pre-operative assessment

## 2021-02-17 NOTE — Op Note (Signed)
02/17/2021  11:26 AM  PATIENT:  Andre Beck  20 y.o. male  PRE-OPERATIVE DIAGNOSIS: Left shoulder recurrent instability with possible recurrent anterior labral tear  POST-OPERATIVE DIAGNOSIS:  Left Shoulder anterior and posterior capsular sprain with recurrent anterior labral tear  PROCEDURE:  Procedure(s): REVISION LEFT SHOULDER ARTHROSCOPIC REVISION ANTERIOR LABRAL REPAIR AND ANTERIOR AND POSTERIOR CAPSULAR  SPRAIN  SURGEON:  Surgeon(s) and Role:    Thornton Park, MD - Primary  ANESTHESIA:   general and paracervical block   PREOPERATIVE INDICATIONS:  Andre Beck is a  20 y.o. male with a diagnosis of Left Shoulder Labral Tear who failed conservative measures and elected for surgical management.    I discussed the risks and benefits of surgery. The risks include but are not limited to infection, bleeding, nerve or blood vessel injury, joint stiffness or loss of motion, persistent pain, weakness or instability, and hardware failure and the need for further surgery. Medical risks include but are not limited to DVT and pulmonary embolism, myocardial infarction, stroke, pneumonia, respiratory failure and death. Patient understood these risks and wished to proceed.   OPERATIVE IMPLANTS: Arthrex bio suturetak anchors  5   OPERATIVE FINDINGS: Left shoulder shoulder recurrent anterior labral tear with anterior and posterior capsular sprain  OPERATIVE PROCEDURE:  I met with the patient preoperative area.  A pre-op history and physical was performed.  I signed the left shoulder according the hospital's correct site of surgery protocol.  I answered all questions by the patient. Patient was brought to the operating room. A left interscalene block with Exparel was performed by the anesthesia service in the preoperative area.  He was brought to the OR where he underwent general endotracheal intubation.  The patient was then positioned in a beachchair position. All bony prominences were adequately  padded including the lower extremities.  Examination under anesthesia revealed increased anterior and posterior translation with load and shift testing, and a mild sulcus sign testing respectively.  The patient was then prepped and draped in a sterile fashion. The patient received 2 g of Ancef prior to the onset of the case.  A timeout was performed to verify the patient's name, date of birth, medical record number, correct site of surgery and correct procedure to be performed.The timeout was also used to verify the patient received antibiotics that all appropriate instruments, implants and radiographic studies were available in the room. Once all in attendance were in agreement case began.  Bony landmarks were drawn out with a surgical marker along with proposed incisions.  An 11 blade was used to establish a posterior portal through which the arthroscope was placed in the glenohumeral joint. An anterior portal was established under direct visualization using an 18-gauge spinal needle for localization. A 5.75 mm arthroscopic cannula was inserted through the anterior portal. A full diagnostic examination of the glenohumeral joint was performed.  Findings on arthroscopy included a recurrent anterior labral tear with displacement of the labrum along the anterior glenoid.  There is a patulous capsule secondary to strain in the anterior and posterior glenohumeral joint.  Patient had a large posterior Hill-Sachs lesion.  There is no evidence of an anterior glenoid fracture however.  An anterolateral portal was established again under direct visualization using an 18-gauge spinal needle. A 7 mm cannula was placed through this anterolateral portal.  The anterior labrum was mobilized off the anterior glenoid with an arthroscopic elevator from the 6:00 to 10 o'clock position.  The nonarticular portion of anterior glenoid was debrided with  a 4.34m resector shaver blade.  Three Arthrex Bio Suturetak anchors were then  placed in the series beginning at approximately the 7 o'clock position.  A 90 degree Arthrex suture lasso was then used to shuttle a single limb of this anchor through the anterior inferior capsule and under the anterior labrum.  Arthroscopic knot tying technique was then used to approximate the anterior labrum to the osseous glenoid.    2 additional anchors were placed at the 8:00 and 9:00 positions following the same technique as above to complete the labral repair/capsulorrhaphy anteriorly.  Switching sticks were then used to place the arthroscope through the anterior portal.  A posterior and posterior lateral portals were established under direct visualization using an 18-gauge spinal needle for localization.  Two additional anchors were placed posteriorly at proximately the 4:30 and 3:00 positions.  The posterior labrum was diminutive.  There was no labral tissue to reconstruct and therefore a posterior capsulorrhaphy was performed instead to provide posterior stabilization.  Two Arthrex bio suture tack anchors were placed using the same technique as the anterior anchors.  The Arthrex percutaneous drill guide sas used to drill the 2 posterior anchors.  Once all 5 anchors were positioned final arthroscopic images were taken.  The capsulorrhaphy was probed and found to be stable.  Patient no longer had excessive anterior and posterior translation to load-and-shift testing under direct visualization using the arthroscope.  The arthroscope was placed into the subacromial space.  There is no bursitis or subacromial spurring.  No procedure was performed in the subacromial space.  All arthroscopic instruments were removed. The 4 arthroscopic portals were closed with 4-0 nylon. A dry, sterile dressing was applied to the left shoulder, along with a Polar Care sleeve. Patient's left arm was then placed in an abduction sling. The patient was awoken and brought to PACU in stable condition. I was scrubbed and present  for the entire case and all sharp and instrument counts were correct at the conclusion the case. I spoke with the patient's girlfriend by phone from the PACU in the postop consultation room to let her know the operation was successful and performed without complication.      KTimoteo Gaul MD

## 2021-02-17 NOTE — Discharge Instructions (Addendum)
Interscalene Nerve Block with Exparel   For your surgery you have received an Interscalene Nerve Block with Exparel. Nerve Blocks affect many types of nerves, including nerves that control movement, pain and normal sensation.  You may experience feelings such as numbness, tingling, heaviness, weakness or the inability to move your arm or the feeling or sensation that your arm has "fallen asleep". A nerve block with Exparel can last up to 5 days.  Usually the weakness wears off first.  The tingling and heaviness usually wear off next.  Finally you may start to notice pain.  Keep in mind that this may occur in any order.  Once a nerve block starts to wear off it is usually completely gone within 60 minutes. ISNB may cause mild shortness of breath, a hoarse voice, blurry vision, unequal pupils, or drooping of the face on the same side as the nerve block.  These symptoms will usually resolve with the numbness.  Very rarely the procedure itself can cause mild seizures. If needed, your surgeon will give you a prescription for pain medication.  It will take about 60 minutes for the oral pain medication to become fully effective.  So, it is recommended that you start taking this medication before the nerve block first begins to wear off, or when you first begin to feel discomfort. Take your pain medication only as prescribed.  Pain medication can cause sedation and decrease your breathing if you take more than you need for the level of pain that you have. Nausea is a common side effect of many pain medications.  You may want to eat something before taking your pain medicine to prevent nausea. After an Interscalene nerve block, you cannot feel pain, pressure or extremes in temperature in the effected arm.  Because your arm is numb it is at an increased risk for injury.  To decrease the possibility of injury, please practice the following:  While you are awake change the position of your arm frequently to  prevent too much pressure on any one area for prolonged periods of time.  If you have a cast or tight dressing, check the color or your fingers every couple of hours.  Call your surgeon with the appearance of any discoloration (white or blue). If you are given a sling to wear before you go home, please wear it  at all times until the block has completely worn off.  Do not get up at night without your sling. Please contact [facility] or your surgeon if you do not begin to regain sensation after 7 days from the surgery.  Anesthesia may be contacted by calling the Same Day Surgery Department, Mon. through Fri., 6 am to 4 pm at [phone].   If you experience any other problems or concerns, please contact your surgeon's office. If you experience severe or prolonged shortness of breath go to the nearest emergency department. AMBULATORY SURGERY  DISCHARGE INSTRUCTIONS   The drugs that you were given will stay in your system until tomorrow so for the next 24 hours you should not:  Drive an automobile Make any legal decisions Drink any alcoholic beverage   You may resume regular meals tomorrow.  Today it is better to start with liquids and gradually work up to solid foods.  You may eat anything you prefer, but it is better to start with liquids, then soup and crackers, and gradually work up to solid foods.   Please notify your doctor immediately if you have any unusual  bleeding, trouble breathing, redness and pain at the surgery site, drainage, fever, or pain not relieved by medication.    Additional Instructions:        Please contact your physician with any problems or Same Day Surgery at [phone], Monday through Friday 6 am to 4 pm, or [place] at [facility] number at [phone].

## 2021-02-17 NOTE — H&P (Signed)
PREOPERATIVE H&P  Chief Complaint: Left Shoulder recurrent Labral Tear  HPI: Andre Beck is a 20 y.o. male who presents for preoperative history and physical with a diagnosis of recurrent Left Shoulder Labral Tear after a reinjury wrestling with a friend on 11/20/20, after undergoing a previous capsulorrhaphy on 11/06/19. Symptoms of instability remain present despite treatment with PT.   MR-arthrogram suggests anterior capsular strain with possible recurrent labral tear. He has agreed with surgical management.   Past Medical History:  Diagnosis Date   Anxiety 2020   COVID-19    Depression    Dislocated shoulder    Past Surgical History:  Procedure Laterality Date   SHOULDER ARTHROSCOPY WITH CAPSULORRHAPHY Left 11/06/2019   Procedure: SHOULDER ATHROSCOPY WITH CAPSULORRHAPHY;  Surgeon: Juanell Fairly, MD;  Location: [facility] ORS;  Service: Orthopedics;  Laterality: Left;   Social History   Socioeconomic History   Marital status: Single    Spouse name: Not on file   Number of children: Not on file   Years of education: Not on file   Highest education level: Not on file  Occupational History   Not on file  Tobacco Use   Smoking status: Never   Smokeless tobacco: Never  Vaping Use   Vaping Use: Never used  Substance and Sexual Activity   Alcohol use: Never   Drug use: Yes    Types: Marijuana   Sexual activity: Not on file  Other Topics Concern   Not on file  Social History Narrative   Lives with girlfriend in apt   Social Determinants of Health   Financial Resource Strain: Not on file  Food Insecurity: Not on file  Transportation Needs: Not on file  Physical Activity: Not on file  Stress: Not on file  Social Connections: Not on file   History reviewed. No pertinent family history. No Known Allergies Prior to Admission medications   Medication Sig Start Date End Date Taking? Authorizing Provider  acetaminophen (TYLENOL) 325 MG tablet Take 650 mg by mouth every 6 (six)  hours as needed for headache.   Yes [provider]  Cholecalciferol (VITAMIN D3 PO) Take 1 tablet by mouth daily.   Yes [provider]  ibuprofen (ADVIL) 600 MG tablet Take 1 tablet (600 mg total) by mouth every 8 (eight) hours as needed for moderate pain. Patient not taking: No sig reported 11/27/20   Shaune Pollack, MD  ondansetron (ZOFRAN) 4 MG tablet Take 1 tablet (4 mg total) by mouth every 8 (eight) hours as needed for nausea or vomiting. Patient not taking: No sig reported 11/06/19   Juanell Fairly, MD  oxyCODONE-acetaminophen (PERCOCET) 5-325 MG tablet Take 1 tablet by mouth every 6 (six) hours as needed for severe pain. Patient not taking: No sig reported 11/27/20 11/27/21  Shaune Pollack, MD  sertraline (ZOLOFT) 50 MG tablet Take 50 mg by mouth daily. 09/06/19   [provider]     Positive ROS: All other systems have been reviewed and were otherwise negative with the exception of those mentioned in the HPI and as above.  Physical Exam: General: Alert, no acute distress Cardiovascular: Regular rate and rhythm, no murmurs rubs or gallops.  No pedal edema Respiratory: Clear to auscultation bilaterally, no wheezes rales or rhonchi. No cyanosis, no use of accessory musculature GI: No organomegaly, abdomen is soft and non-tender nondistended with positive bowel sounds. Skin: Skin intact, no lesions within the operative field. Neurologic: Sensation intact distally Psychiatric: Patient is competent for consent with normal mood and  affect Lymphatic: No cervical lymphadenopathy  MUSCULOSKELETAL: Left shoulder:  previous incisions are healed.  No erythema, ecchymosis and swelling.   Full ROM.  Mild apprehension, no gross instability.  NVI.  5/5 strength.    Assessment: Left Shoulder instability with possible recurrent Labral Tear  Plan: Plan for Procedure(s): REVISION LEFT SHOULDER ARTHROSCOPIC LABRAL REPAIR AND CAPSULORRHAPHY  I reviewed the details of the  operative plan with the patient.   I explained that I would examine the entire labrum and capsule and fix and labral and/or perform a capsulorrhaphy as needed.   He understands that there may not be any structural injury found as the MRI was inconclusive, and in that situation he will need to continue non-operative management with extended PT.    A pre-op history and physical was performed at the bedside this AM.  I marked the left shoulder according to the hosptial's correct site of surgery protocol.  I have personally reviewed his MRI in preparation of this case.    I discussed the risks and benefits of surgery. The risks include but are not limited to infection, bleeding, nerve or blood vessel injury, joint stiffness or loss of motion, persistent pain, weakness or instability, retear of the labrum or capsule, hardware failure and the need for further surgery including open stabilization if he experiences recurrent instabilty. Medical risks include but are not limited to DVT and pulmonary embolism, myocardial infarction, stroke, pneumonia, respiratory failure and death. Patient understood these risks and wished to proceed.   Juanell Fairly, MD   02/17/2021 7:50 AM

## 2021-02-25 DIAGNOSIS — Z419 Encounter for procedure for purposes other than remedying health state, unspecified: Secondary | ICD-10-CM | POA: Diagnosis not present

## 2021-03-28 DIAGNOSIS — Z419 Encounter for procedure for purposes other than remedying health state, unspecified: Secondary | ICD-10-CM | POA: Diagnosis not present

## 2021-04-28 DIAGNOSIS — Z419 Encounter for procedure for purposes other than remedying health state, unspecified: Secondary | ICD-10-CM | POA: Diagnosis not present

## 2021-05-28 DIAGNOSIS — Z419 Encounter for procedure for purposes other than remedying health state, unspecified: Secondary | ICD-10-CM | POA: Diagnosis not present

## 2021-06-28 DIAGNOSIS — Z419 Encounter for procedure for purposes other than remedying health state, unspecified: Secondary | ICD-10-CM | POA: Diagnosis not present

## 2021-07-28 DIAGNOSIS — Z419 Encounter for procedure for purposes other than remedying health state, unspecified: Secondary | ICD-10-CM | POA: Diagnosis not present

## 2021-08-28 DIAGNOSIS — Z419 Encounter for procedure for purposes other than remedying health state, unspecified: Secondary | ICD-10-CM | POA: Diagnosis not present

## 2021-09-28 DIAGNOSIS — Z419 Encounter for procedure for purposes other than remedying health state, unspecified: Secondary | ICD-10-CM | POA: Diagnosis not present

## 2021-10-26 DIAGNOSIS — Z419 Encounter for procedure for purposes other than remedying health state, unspecified: Secondary | ICD-10-CM | POA: Diagnosis not present

## 2021-11-26 DIAGNOSIS — Z419 Encounter for procedure for purposes other than remedying health state, unspecified: Secondary | ICD-10-CM | POA: Diagnosis not present

## 2021-12-26 DIAGNOSIS — Z419 Encounter for procedure for purposes other than remedying health state, unspecified: Secondary | ICD-10-CM | POA: Diagnosis not present

## 2022-01-26 DIAGNOSIS — Z419 Encounter for procedure for purposes other than remedying health state, unspecified: Secondary | ICD-10-CM | POA: Diagnosis not present

## 2022-02-25 DIAGNOSIS — Z419 Encounter for procedure for purposes other than remedying health state, unspecified: Secondary | ICD-10-CM | POA: Diagnosis not present

## 2022-03-28 DIAGNOSIS — Z419 Encounter for procedure for purposes other than remedying health state, unspecified: Secondary | ICD-10-CM | POA: Diagnosis not present
